# Patient Record
Sex: Male | Born: 1940 | Race: White | Hispanic: No | Marital: Married | Smoking: Former smoker
Health system: Southern US, Community
[De-identification: ages and names within clinical notes are randomized; demographics above are authoritative.]

## PROBLEM LIST (undated history)

## (undated) DIAGNOSIS — C8590 Non-Hodgkin lymphoma, unspecified, unspecified site: Secondary | ICD-10-CM

## (undated) DIAGNOSIS — F0390 Unspecified dementia without behavioral disturbance: Secondary | ICD-10-CM

## (undated) DIAGNOSIS — N400 Enlarged prostate without lower urinary tract symptoms: Secondary | ICD-10-CM

## (undated) DIAGNOSIS — I2510 Atherosclerotic heart disease of native coronary artery without angina pectoris: Secondary | ICD-10-CM

## (undated) DIAGNOSIS — I4891 Unspecified atrial fibrillation: Secondary | ICD-10-CM

## (undated) DIAGNOSIS — J449 Chronic obstructive pulmonary disease, unspecified: Secondary | ICD-10-CM

## (undated) DIAGNOSIS — I10 Essential (primary) hypertension: Secondary | ICD-10-CM

## (undated) HISTORY — PX: JOINT REPLACEMENT: SHX530

## (undated) HISTORY — PX: HERNIA REPAIR: SHX51

## (undated) HISTORY — PX: CORONARY ANGIOPLASTY WITH STENT PLACEMENT: SHX49

---

## 2004-08-16 ENCOUNTER — Ambulatory Visit: Payer: Self-pay | Admitting: Family Medicine

## 2005-08-30 ENCOUNTER — Ambulatory Visit: Payer: Self-pay | Admitting: Family Medicine

## 2005-09-24 ENCOUNTER — Other Ambulatory Visit: Payer: Self-pay

## 2005-09-30 ENCOUNTER — Inpatient Hospital Stay: Payer: Self-pay | Admitting: Specialist

## 2008-07-19 ENCOUNTER — Ambulatory Visit: Payer: Self-pay | Admitting: Gastroenterology

## 2009-12-25 ENCOUNTER — Ambulatory Visit: Payer: Self-pay | Admitting: Family Medicine

## 2012-08-03 DIAGNOSIS — N401 Enlarged prostate with lower urinary tract symptoms: Secondary | ICD-10-CM | POA: Insufficient documentation

## 2012-08-03 DIAGNOSIS — N529 Male erectile dysfunction, unspecified: Secondary | ICD-10-CM | POA: Insufficient documentation

## 2012-08-06 ENCOUNTER — Ambulatory Visit: Payer: Self-pay | Admitting: Internal Medicine

## 2012-08-26 ENCOUNTER — Ambulatory Visit: Payer: Self-pay | Admitting: Internal Medicine

## 2012-08-26 LAB — CK TOTAL AND CKMB (NOT AT ARMC)
CK, Total: 69 U/L (ref 35–232)
CK-MB: 2.4 ng/mL (ref 0.5–3.6)

## 2012-08-27 LAB — BASIC METABOLIC PANEL
BUN: 14 mg/dL (ref 7–18)
Calcium, Total: 8.6 mg/dL (ref 8.5–10.1)
Creatinine: 1.2 mg/dL (ref 0.60–1.30)
EGFR (African American): >60
EGFR (Non-African Amer.): >60
Glucose: 97 mg/dL (ref 65–99)
Osmolality: 291 (ref 275–301)
Sodium: 146 mmol/L — ABNORMAL HIGH (ref 136–145)

## 2013-03-16 ENCOUNTER — Ambulatory Visit: Payer: Self-pay | Admitting: Urology

## 2013-03-16 LAB — CBC WITH DIFFERENTIAL/PLATELET
HCT: 45.1 % (ref 40.0–52.0)
Lymphocyte #: 2.5 10*3/uL (ref 1.0–3.6)
Lymphocyte %: 27.3 %
MCH: 29.5 pg (ref 26.0–34.0)
MCHC: 33.9 g/dL (ref 32.0–36.0)
MCV: 87 fL (ref 80–100)
Monocyte %: 6.7 %
Neutrophil #: 5.8 10*3/uL (ref 1.4–6.5)
Neutrophil %: 63.1 %
RBC: 5.19 10*6/uL (ref 4.40–5.90)
WBC: 9.2 10*3/uL (ref 3.8–10.6)

## 2013-03-16 LAB — BASIC METABOLIC PANEL
BUN: 16 mg/dL (ref 7–18)
Calcium, Total: 9.1 mg/dL (ref 8.5–10.1)
Creatinine: 1.07 mg/dL (ref 0.60–1.30)
Osmolality: 284 (ref 275–301)

## 2013-03-23 ENCOUNTER — Ambulatory Visit: Payer: Self-pay | Admitting: Urology

## 2013-03-24 LAB — BASIC METABOLIC PANEL
Anion Gap: 5 — ABNORMAL LOW (ref 7–16)
BUN: 17 mg/dL (ref 7–18)
Co2: 29 mmol/L (ref 21–32)
Creatinine: 1.23 mg/dL (ref 0.60–1.30)
EGFR (African American): >60
EGFR (Non-African Amer.): 60 — ABNORMAL LOW
Osmolality: 285 (ref 275–301)
Potassium: 3.7 mmol/L (ref 3.5–5.1)
Sodium: 142 mmol/L (ref 136–145)

## 2013-03-24 LAB — CBC WITH DIFFERENTIAL/PLATELET
Basophil #: 0 10*3/uL (ref 0.0–0.1)
Eosinophil %: 1.7 %
HCT: 38.5 % — ABNORMAL LOW (ref 40.0–52.0)
HGB: 13.4 g/dL (ref 13.0–18.0)
Lymphocyte %: 25.5 %
MCHC: 34.8 g/dL (ref 32.0–36.0)
Neutrophil %: 64.5 %
RBC: 4.43 10*6/uL (ref 4.40–5.90)
RDW: 13.7 % (ref 11.5–14.5)

## 2013-04-14 DIAGNOSIS — R310 Gross hematuria: Secondary | ICD-10-CM | POA: Insufficient documentation

## 2014-05-11 DIAGNOSIS — M109 Gout, unspecified: Secondary | ICD-10-CM | POA: Insufficient documentation

## 2014-05-11 DIAGNOSIS — I10 Essential (primary) hypertension: Secondary | ICD-10-CM | POA: Insufficient documentation

## 2014-05-11 DIAGNOSIS — M129 Arthropathy, unspecified: Secondary | ICD-10-CM | POA: Insufficient documentation

## 2014-05-11 DIAGNOSIS — I499 Cardiac arrhythmia, unspecified: Secondary | ICD-10-CM | POA: Insufficient documentation

## 2015-02-14 NOTE — Discharge Summary (Signed)
PATIENT NAME:  Victor Ramos, Victor Ramos MR#:  [id number] DATE OF BIRTH:  Dec 21, 1940  DATE OF ADMISSION:  08/26/2012 DATE OF DISCHARGE:  08/27/2012  DISCHARGE DIAGNOSES:  1. Coronary artery disease.  2. Hypertension.  3. Hyperlipidemia.  4. Atrial fibrillation.   HISTORY: This is a 74 year old with chest discomfort and unstable angina with coronary artery disease. The patient underwent cardiac catheterization showing minimal cardiac atherosclerosis of the left coronary artery system with a significant stenosis of the right coronary artery. The patient underwent PCI and stent placement of a drug-eluting stent without complication. Since then he has done fairly well with no evidence of further significant symptoms on appropriate medications. The patient was discharged on appropriate medication management with followup in 3 to 5 days.   DISCHARGE MEDICATIONS: 1. Plavix 75 mg p.o. daily.  2. Aspirin 325 mg p.o. daily. 3. Quinapril 20 mg p.o. daily.  4. Amlodipine 5 mg p.o. daily.  5. Tamsulosin 0.4 mg p.o. at bedtime.  6. Allopurinol 100 mg p.o. daily.  7. Metoprolol 25 mg p.o. daily.    ____________________________ Corey Skains, MD bjk:bjt D: 08/27/2012 08:49:33 ET T: 08/27/2012 10:21:10 ET JOB#: [id number]  cc: Corey Skains, MD, <Dictator> Corey Skains MD ELECTRONICALLY SIGNED 09/03/2012 13:44

## 2015-02-17 NOTE — Op Note (Signed)
PATIENT NAME:  Victor, Ramos MR#:  [id number] DATE OF BIRTH:  Aug 12, 1941  DATE OF PROCEDURE:  03/23/2013  PREOPERATIVE DIAGNOSIS: Benign prostatic hypertrophy with bladder outlet obstruction.   POSTOPERATIVE DIAGNOSIS: Benign prostatic hypertrophy with bladder outlet obstruction.  PROCEDURE: Transurethral resection of the prostate.   SURGEON: Edrick Oh, MD   ANESTHESIA: Laryngeal mask airway anesthesia.   INDICATIONS: The patient is a 74 year old gentleman with significant long-standing prostatic hypertrophy. He has been managed on medical management for some time. He has had significant progressive worsening of his voiding symptoms. Previous cystoscopy has demonstrated extensive intravesical protrusion of the prostate with partial median lobe formation. He presents for transurethral resection of the prostate.   PROCEDURE: After informed consent was obtained, the patient was taken to the operating room and placed in the dorsal lithotomy position under laryngeal mask airway anesthesia. The patient was then prepped and draped in the usual standard fashion. The saline resectoscope sheath was placed. Dilation of the distal meatal urethra was necessary for placement. This was dilated to 30-French utilizing male sounds. The scope was then easily passed utilizing the visual obturator. Upon entering the prostatic fossa, prominent bilobar hypertrophy was visualized with complete visual obstruction. Prostatic concretions were noted around the verumontanum. Extensive intravesical protrusion of the prostate was noted at the level of the bladder neck. The ureteral orifices were not initially easily identified due to the extent of intravesical tissue. A moderate amount of anterior tissue was also present. The bladder demonstrated no gross mucosal lesions throughout. The resectoscope loop was then placed through the scope. Resection was begun at the level of the central bladder neck and taken back to the level  of the verumontanum. A large amount of intravesical tissue was taken down as the resection progressed. Intermittent examination subsequently demonstrated the ureteral orifices.  These were noted to be an adequate distance from the bladder neck proper. The resection was taken down to the bladder neck extending along the lateral aspects of the prostate. A moderate amount of anterior tissue was also noted. This was also taken down utilizing the loop. Resection was then undertaken on the lateral lobes to the level of the verumontanum. A slight amount of tissue was left remaining at the level of the verumontanum. A more than adequate opening, however, was present. Multiple nodular areas of hypertrophy were noted throughout the prostate. Additional resection was undertaken on these areas. A moderate amount of purulent cavitary areas were also noted within the prostate. Numerous concretions were noted along the level of the ejaculatory duct. Once the bulk of the resection was undertaken, the prostate chips were irrigated from the bladder. The button electrode was then utilized to resect additional prostate base and lateral tissue. The areas were then extensively cauterized utilizing the loop. Several additional prostate chips were present within the bladder. They were irrigated free. The bleeding was adequately controlled with overall approximately 100 mL of blood loss estimated upon completion of the resection. The resectoscope was removed. A 22 French 3-way Foley catheter was placed to gravity drainage and to continuous bladder irrigation. The patient was returned to the supine position and awakened from laryngeal mask airway anesthesia. He was taken to the recovery room in stable condition. There were no problems or complications. The patient tolerated the procedure well.  ____________________________ Denice Bors. Jacqlyn Larsen, MD bsc:cb D: 03/23/2013 16:08:12 ET T: 03/23/2013 16:57:26 ET JOB#: [id number]  cc: Denice Bors. Jacqlyn Larsen,  MD, <Dictator> Denice Bors  MD ELECTRONICALLY SIGNED 03/25/2013 11:31

## 2015-02-17 NOTE — Discharge Summary (Signed)
PATIENT NAME:  Victor Ramos, Victor Ramos MR#:  [id number] DATE OF BIRTH:  12/17/1940  DATE OF ADMISSION:  03/23/2013 DATE OF DISCHARGE:  03/24/2013  PRINCIPLE DIAGNOSIS:  Benign prostatic hypertrophy, bladder outlet obstruction.   POSTOPERATIVE DIAGNOSES:  Benign prostatic hypertrophy, bladder outlet obstruction.   PROCEDURE:  Transurethral resection of prostate.   INDICATIONS:  The patient is a 74 year old gentleman with a long history of prostatic hypertrophy who has been on medical management.  He has had progressive worsening of his voiding symptoms.  He has elected to proceed with transurethral resection of prostate as treatment for his obstructive symptoms.  He presents for this purpose.   HOSPITAL COURSE:  Victor Ramos was taken to the operating room on 03/23/2013 at which time he underwent a transurethral resection of the prostate utilizing the saline resectoscope.  A significant amount of intravesical tissue was present and resected.  No significant bleeding was encountered.  He had been maintained on aspirin due to cardiac issues.  This was not a significant issue during the surgery.  His postoperative course was without significant problems or complications.  He was maintained on continuous bladder irrigation until the morning of postoperative day 1.  His bladder irrigation was only minimally blood-tinged on the irrigation.  The Foley catheter and bladder irrigation was discontinued.  He was noted to void spontaneously with a minimum bladder residual.  He remained afebrile, vital signs stable throughout his hospitalization.  He was tolerating a general diet.  He was ambulating without difficulty.  He continued to do well and was subsequently discharged to home on 03/24/2013.  He was discharged on his usual admission medications in addition to his aspirin.  The only medication he is off of at present is Plavix.  He was discharged on Cipro 500 mg twice daily for a seven day course and Vicodin 1 to 2 every  4 to 6 hours as needed for pain.  He is to avoid any significant heavy lifting or straining for at least 2 to 3 weeks.  He is to follow up in 7 to 14 days for re-evaluation in the office.  He is to notify us if there are any further problems or questions in the interim.    ____________________________ Denice Bors Jacqlyn Larsen, MD bsc:ea D: 03/25/2013 22:54:28 ET T: 03/26/2013 06:44:47 ET JOB#: [id number]  cc: Denice Bors. Jacqlyn Larsen, MD, <Dictator> Denice Bors. Jacqlyn Larsen, MD Denice Bors  MD ELECTRONICALLY SIGNED 03/31/2013 13:33

## 2015-04-13 ENCOUNTER — Ambulatory Visit
Admission: EM | Admit: 2015-04-13 | Discharge: 2015-04-13 | Disposition: A | Discharge status: Home / Self Care | Payer: Medicare Other | Attending: Internal Medicine | Admitting: Internal Medicine

## 2015-04-13 DIAGNOSIS — N400 Enlarged prostate without lower urinary tract symptoms: Secondary | ICD-10-CM | POA: Insufficient documentation

## 2015-04-13 DIAGNOSIS — I482 Chronic atrial fibrillation, unspecified: Secondary | ICD-10-CM | POA: Insufficient documentation

## 2015-04-13 DIAGNOSIS — Z79899 Other long term (current) drug therapy: Secondary | ICD-10-CM | POA: Insufficient documentation

## 2015-04-13 DIAGNOSIS — I10 Essential (primary) hypertension: Secondary | ICD-10-CM | POA: Insufficient documentation

## 2015-04-13 DIAGNOSIS — Z87891 Personal history of nicotine dependence: Secondary | ICD-10-CM | POA: Diagnosis not present

## 2015-04-13 DIAGNOSIS — Z7982 Long term (current) use of aspirin: Secondary | ICD-10-CM | POA: Insufficient documentation

## 2015-04-13 DIAGNOSIS — Z8679 Personal history of other diseases of the circulatory system: Secondary | ICD-10-CM

## 2015-04-13 DIAGNOSIS — X58XXXA Exposure to other specified factors, initial encounter: Secondary | ICD-10-CM | POA: Diagnosis not present

## 2015-04-13 DIAGNOSIS — S30861A Insect bite (nonvenomous) of abdominal wall, initial encounter: Secondary | ICD-10-CM | POA: Diagnosis not present

## 2015-04-13 DIAGNOSIS — R0609 Other forms of dyspnea: Secondary | ICD-10-CM | POA: Diagnosis not present

## 2015-04-13 DIAGNOSIS — I2510 Atherosclerotic heart disease of native coronary artery without angina pectoris: Secondary | ICD-10-CM | POA: Insufficient documentation

## 2015-04-13 HISTORY — DX: Unspecified atrial fibrillation: I48.91

## 2015-04-13 HISTORY — DX: Essential (primary) hypertension: I10

## 2015-04-13 HISTORY — DX: Benign prostatic hyperplasia without lower urinary tract symptoms: N40.0

## 2015-04-13 NOTE — Discharge Instructions (Signed)
See Dr Serafina Royals, [facility], as above for worsening breathlessness, to discuss further evaluation.

## 2015-04-13 NOTE — ED Notes (Signed)
Found tick posterior right low back about a month ago. Wife removed tick and a welt was noted. Since removal patient has noticed increased weakness and states "I get winded more easily than normal". +joint pain. Denies fever and chills.

## 2015-04-13 NOTE — ED Provider Notes (Signed)
CSN: [id number]     Arrival date & time 04/13/15  0930 History   First MD Initiated Contact with Patient 04/13/15 1005     Chief Complaint  Patient presents with  . Insect Bite   HPI  Patient removed a non-engorged tick from his right flank about a month ago, and had an itchy red spot, 1.5 inches, that slowly resolved over a couple weeks. Also in the last month has had fatigue, and worsening exercise tolerance for usual activities. Having to stop and rest during mowing and weedeating, feeling particularly breathless in the last week. Couple nights ago, had marked breathlessness going up a flight of stairs. When he stops to rest, symptoms subside in about 15 minutes. No paroxysmal nocturnal dyspnea. No change in leg swelling. Chronic A. Fib, on Pradaxa, was asymptomatic when dx'ed with a fib. Also having some achiness.  Past Medical History  Diagnosis Date  . Hypertension   . Atrial fibrillation   . BPH (benign prostatic hyperplasia)    Past Surgical History  Procedure Laterality Date  . Coronary angioplasty with stent placement    . Joint replacement    . Hernia repair     Family History  Problem Relation Age of Onset  . Cancer Mother   . Heart attack Father   . Cancer Brother    History  Substance Use Topics  . Smoking status: Former Research scientist (life sciences)  . Smokeless tobacco: Not on file  . Alcohol Use: Yes     Comment: rarely    Review of Systems  All other systems reviewed and are negative.   Allergies  Review of patient's allergies indicates no known allergies.  Home Medications   Prior to Admission medications   Medication Sig Start Date End Date Taking? Authorizing Provider  amLODipine (NORVASC) 10 MG tablet Take 10 mg by mouth daily.   Yes Historical Provider, MD  aspirin 81 MG tablet Take 81 mg by mouth daily.   Yes Historical Provider, MD  co-enzyme Q-10 30 MG capsule Take 100 mg by mouth daily.   Yes Historical Provider, MD  dabigatran (PRADAXA) 150 MG CAPS capsule Take  150 mg by mouth 2 (two) times daily.   Yes Historical Provider, MD  diphenhydrAMINE (BENADRYL) 25 MG tablet Take 25 mg by mouth every 6 (six) hours as needed.   Yes Historical Provider, MD  finasteride (PROSCAR) 5 MG tablet Take 5 mg by mouth daily.   Yes Historical Provider, MD  lisinopril (PRINIVIL,ZESTRIL) 40 MG tablet Take 40 mg by mouth daily.   Yes Historical Provider, MD  magnesium 30 MG tablet Take 250 mg by mouth 2 (two) times daily.   Yes Historical Provider, MD  Melatonin 10 MG TABS Take by mouth.   Yes Historical Provider, MD  metoprolol succinate (TOPROL-XL) 25 MG 24 hr tablet Take 25 mg by mouth 2 (two) times daily.   Yes Historical Provider, MD  potassium chloride (K-DUR,KLOR-CON) 10 MEQ tablet Take 10 mEq by mouth once.   Yes Historical Provider, MD  tamsulosin (FLOMAX) 0.4 MG CAPS capsule Take 0.4 mg by mouth.   Yes Historical Provider, MD   BP 135/96 mmHg  Pulse 80  Temp(Src) 98.5 F (36.9 C) (Oral)  Ht 5\' 8"  (1.727 m)  Wt 210 lb (95.255 kg)  BMI 31.94 kg/m2  SpO2 100% Physical Exam  Constitutional: He is oriented to person, place, and time. No distress.  Alert, nicely groomed  HENT:  Head: Atraumatic.  Eyes:  Conjugate gaze, no eye redness/drainage  Neck: Neck supple.  Cardiovascular: Normal rate.   irreg irreg rhythm  Pulmonary/Chest: He has no wheezes. He has no rales.  Somewhat coarse breath sounds throughout, symmetric breath sounds; slightly increased resp effort, looks slightly breathless at rest  Abdominal: Soft. He exhibits no distension.  Musculoskeletal: Normal range of motion.  1+ B LE pitting edema  Neurological: He is alert and oriented to person, place, and time.  Skin: Skin is warm and dry.  No cyanosis; pink/tanned  Nursing note and vitals reviewed.   ED Course  Procedures (including critical care time) Labs Review Labs Reviewed - No data to display  Imaging Review No results found.  ECG in urgent care demonstrates A fib, rate of 64.   QRS axis 11/unremarkable.  No acute ST or T wave changes.  Low voltage across precordium/V1,V2,V3, seen on previous ECG of 08/26/2012 10:09. I do not appreciate significant change from the previous ECG.    MDM   1. DOE (dyspnea on exertion)   2. Personal history of coronary artery disease    Pt's pcp/Dr Niger Reid/[facility] not in office.  Spoke with Dr [facility], who arranged for pt to be seen in his office with Dr Nehemiah Massed at 1:30pm today for evaluation for accelerated anginal equivalent.  Sherlene Shams, MD 04/13/15 [phone]

## 2016-09-27 ENCOUNTER — Emergency Department
Admission: EM | Admit: 2016-09-27 | Discharge: 2016-09-27 | Disposition: A | Discharge status: Home / Self Care | Payer: Medicare Other | Attending: Emergency Medicine | Admitting: Emergency Medicine

## 2016-09-27 ENCOUNTER — Encounter: Payer: Self-pay | Admitting: Emergency Medicine

## 2016-09-27 ENCOUNTER — Emergency Department: Payer: Medicare Other

## 2016-09-27 DIAGNOSIS — Z7982 Long term (current) use of aspirin: Secondary | ICD-10-CM | POA: Diagnosis not present

## 2016-09-27 DIAGNOSIS — M5030 Other cervical disc degeneration, unspecified cervical region: Secondary | ICD-10-CM | POA: Insufficient documentation

## 2016-09-27 DIAGNOSIS — Z87891 Personal history of nicotine dependence: Secondary | ICD-10-CM | POA: Diagnosis not present

## 2016-09-27 DIAGNOSIS — Z955 Presence of coronary angioplasty implant and graft: Secondary | ICD-10-CM | POA: Diagnosis not present

## 2016-09-27 DIAGNOSIS — Z79899 Other long term (current) drug therapy: Secondary | ICD-10-CM | POA: Insufficient documentation

## 2016-09-27 DIAGNOSIS — I10 Essential (primary) hypertension: Secondary | ICD-10-CM | POA: Insufficient documentation

## 2016-09-27 DIAGNOSIS — M542 Cervicalgia: Secondary | ICD-10-CM | POA: Diagnosis present

## 2016-09-27 MED ORDER — CELECOXIB 200 MG PO CAPS: 200.0000 mg | ORAL_CAPSULE | Freq: Every day | ORAL | 1 refills | Status: AC

## 2016-09-27 MED ORDER — HYDROCODONE-ACETAMINOPHEN 5-325 MG PO TABS
1.0000 | ORAL_TABLET | Freq: Once | ORAL | Status: AC
Start: 2016-09-27 — End: 2016-09-27
  Administered 2016-09-27: 1 via ORAL
  Filled 2016-09-27: qty 1

## 2016-09-27 MED ORDER — HYDROCODONE-ACETAMINOPHEN 5-325 MG PO TABS: 1.0000 | ORAL_TABLET | ORAL | 0 refills | Status: DC | PRN

## 2016-09-27 NOTE — ED Triage Notes (Signed)
Pt reports has knot on the back of his neck for 5-10 years. Pt states his neck began to hurt 3-4 days ago. Small lump palpable to back of neck. Pt in no apparent distress in triage.

## 2016-09-27 NOTE — ED Notes (Signed)
Patient transported to CT scan at this time.

## 2016-09-27 NOTE — Discharge Instructions (Signed)
Begin taking pain medication as directed. be aware that this medicine can cause drowsiness increase her risk for falling. Begin taking Celebrex with food as directed. Contact your regular medical doctor if any continued problems.

## 2016-09-27 NOTE — ED Provider Notes (Signed)
[facility] Emergency Department Provider Note  ____________________________________________   First MD Initiated Contact with Patient 09/27/16 [phone]     (approximate)  I have reviewed the triage vital signs and the nursing notes.   HISTORY  Chief Complaint Neck Pain and Cyst   HPI Victor Ramos is a 75 y.o. male for complaint of neck pain.He states he has a knot on his neck for the last 5-10 years. There is been no changes in the area or size of this "knot". Patient states that his neck began hurting 3-4 days ago. He states that a year ago he was seen by ENT for ear pain and was told that most likely it was arthritis. He has been taking Celebrex for the last year and ran out of it a week or so ago. Until then he had decreased neck pain. Neck pain is worse with range of motion. He denies any paresthesias in his upper or lower extremities. He denies any fever and wife denies seeing his "knot" looking red. Currently he rates his pain as a 5/10.   Past Medical History:  Diagnosis Date  . Atrial fibrillation ([place])   . BPH (benign prostatic hyperplasia)   . Hypertension     There are no active problems to display for this patient.   Past Surgical History:  Procedure Laterality Date  . CORONARY ANGIOPLASTY WITH STENT PLACEMENT    . HERNIA REPAIR    . JOINT REPLACEMENT      Prior to Admission medications   Medication Sig Start Date End Date Taking? Authorizing Provider  amLODipine (NORVASC) 10 MG tablet Take 10 mg by mouth daily.    Historical Provider, MD  aspirin 81 MG tablet Take 81 mg by mouth daily.    Historical Provider, MD  celecoxib (CELEBREX) 200 MG capsule Take 1 capsule (200 mg total) by mouth daily. 09/27/16 09/27/17  Johnn Hai, PA-C  co-enzyme Q-10 30 MG capsule Take 100 mg by mouth daily.    Historical Provider, MD  dabigatran (PRADAXA) 150 MG CAPS capsule Take 150 mg by mouth 2 (two) times daily.    Historical Provider, MD    diphenhydrAMINE (BENADRYL) 25 MG tablet Take 25 mg by mouth every 6 (six) hours as needed.    Historical Provider, MD  finasteride (PROSCAR) 5 MG tablet Take 5 mg by mouth daily.    Historical Provider, MD  HYDROcodone-acetaminophen (NORCO/VICODIN) 5-325 MG tablet Take 1 tablet by mouth every 4 (four) hours as needed for moderate pain. 09/27/16   Johnn Hai, PA-C  lisinopril (PRINIVIL,ZESTRIL) 40 MG tablet Take 40 mg by mouth daily.    Historical Provider, MD  magnesium 30 MG tablet Take 250 mg by mouth 2 (two) times daily.    Historical Provider, MD  Melatonin 10 MG TABS Take by mouth.    Historical Provider, MD  metoprolol succinate (TOPROL-XL) 25 MG 24 hr tablet Take 25 mg by mouth 2 (two) times daily.    Historical Provider, MD  potassium chloride (K-DUR,KLOR-CON) 10 MEQ tablet Take 10 mEq by mouth once.    Historical Provider, MD  tamsulosin (FLOMAX) 0.4 MG CAPS capsule Take 0.4 mg by mouth.    Historical Provider, MD    Allergies Patient has no known allergies.  Family History  Problem Relation Age of Onset  . Cancer Mother   . Heart attack Father   . Cancer Brother     Social History Social History  Substance Use Topics  .  Smoking status: Former Research scientist (life sciences)  . Smokeless tobacco: Not on file  . Alcohol use Yes     Comment: rarely    Review of Systems Constitutional: No fever/chills ENT: No sore throat. Cardiovascular: Denies chest pain. Respiratory: Denies shortness of breath. Gastrointestinal: No abdominal pain.  No nausea, no vomiting.   Musculoskeletal: Positive for neck pain. Skin: Negative for rash. Neurological: Negative for headaches, focal weakness or numbness.  10-point ROS otherwise negative.  ____________________________________________   PHYSICAL EXAM:  VITAL SIGNS: ED Triage Vitals [09/27/16 0714]  Enc Vitals Group     BP 131/69     Pulse Rate 90     Resp 18     Temp 98.1 F (36.7 C)     Temp Source Oral     SpO2 92 %     Weight 208 lb  (94.3 kg)     Height 5\' 8"  (1.727 m)     Head Circumference      Peak Flow      Pain Score 5     Pain Loc      Pain Edu?      Excl. in [place]?     Constitutional: Alert and oriented. Well appearing and in no acute distress. Eyes: Conjunctivae are normal. PERRL. EOMI. Head: Atraumatic. Nose: No congestion/rhinnorhea. Neck: No stridor.  Tenderness on palpation of cervical spine posteriorly. Range of motion increases pain. Range of motion is restricted secondary to pain. No gross deformity is noted. No active muscle spasm seen. Hematological/Lymphatic/Immunilogical: No cervical lymphadenopathy. Cardiovascular: Normal rate, regular rhythm. Grossly normal heart sounds.  Good peripheral circulation. Respiratory: Normal respiratory effort.  No retractions. Lungs CTAB. Gastrointestinal: Soft and nontender. No distention.  Musculoskeletal: Moves upper and lower extremities without any difficulty. Good muscle strength bilaterally upper and lower extremities. Neurologic:  Normal speech and language. No gross focal neurologic deficits are appreciated. No gait instability. Skin:  Skin is warm, dry and intact. On examination of the upper back there is a non-erythematous soft and nontender lesion most likely a sebaceous cyst approximately 2 cm in diameter. Area is mobile, nontender, no drainage. This area is located to the right upper thorax. Psychiatric: Mood and affect are normal. Speech and behavior are normal.  ____________________________________________   LABS (all labs ordered are listed, but only abnormal results are displayed)  Labs Reviewed - No data to display  RADIOLOGY Cervical spine x-ray per radiologist shows multilevel disc space narrowing more pronounced at C5-C6. I, Johnn Hai, personally viewed and evaluated these images (plain radiographs) as part of my medical decision making, as well as reviewing the written report by the  radiologist.  ____________________________________________   PROCEDURES  Procedure(s) performed: None  Procedures  Critical Care performed: No  ____________________________________________   INITIAL IMPRESSION / ASSESSMENT AND PLAN / ED COURSE  Pertinent labs & imaging results that were available during my care of the patient were reviewed by me and considered in my medical decision making (see chart for details).    Clinical Course    Patient was given Norco while in the emergency room since he was not the driver. We discussed restarting his Celebrex 200 mg as he's been taking his done well over the last year for his arthritis. Patient states he has not told his medical doctor about Celebrex but was given this prescription by Dr. Tami Ribas when he was seen for an ear problem. Patient has continued to take the prescription for the past year without any difficulties. We discussed restarting the  Celebrex and he is to take Norco as needed for severe pain. Patient states that he will follow-up with his primary care doctor at the [facility] for any continued problems. Patient was somewhat disgruntled over not seeing an M.D. however when this was offered patient states that he will not sit around any longer and wait on anyone.  ____________________________________________   FINAL CLINICAL IMPRESSION(S) / ED DIAGNOSES  Final diagnoses:  Cervical spine pain  Degenerative disc disease, cervical      NEW MEDICATIONS STARTED DURING THIS VISIT:  Discharge Medication List as of 09/27/2016  9:10 AM    START taking these medications   Details  celecoxib (CELEBREX) 200 MG capsule Take 1 capsule (200 mg total) by mouth daily., Starting Fri 09/27/2016, Until Sat 09/27/2017, Print    HYDROcodone-acetaminophen (NORCO/VICODIN) 5-325 MG tablet Take 1 tablet by mouth every 4 (four) hours as needed for moderate pain., Starting Fri 09/27/2016, Print         Note:  This document was prepared  using Dragon voice recognition software and may include unintentional dictation errors.    Johnn Hai, PA-C 09/27/16 [place], MD 09/29/16 [phone]

## 2018-07-11 ENCOUNTER — Emergency Department
Admission: EM | Admit: 2018-07-11 | Discharge: 2018-07-11 | Disposition: A | Discharge status: Home / Self Care | Payer: Medicare Other | Source: Home / Self Care

## 2018-07-11 ENCOUNTER — Other Ambulatory Visit: Payer: Self-pay

## 2018-07-11 DIAGNOSIS — K460 Unspecified abdominal hernia with obstruction, without gangrene: Secondary | ICD-10-CM | POA: Diagnosis not present

## 2018-07-11 DIAGNOSIS — K4031 Unilateral inguinal hernia, with obstruction, without gangrene, recurrent: Secondary | ICD-10-CM | POA: Diagnosis not present

## 2018-07-11 DIAGNOSIS — Z5321 Procedure and treatment not carried out due to patient leaving prior to being seen by health care provider: Secondary | ICD-10-CM | POA: Insufficient documentation

## 2018-07-11 DIAGNOSIS — R1031 Right lower quadrant pain: Secondary | ICD-10-CM | POA: Insufficient documentation

## 2018-07-11 NOTE — ED Triage Notes (Signed)
First Nurse Note:  C/O RLQ and groin pain.  Patient states he thinks he has an inguinal hernia.  Has had several in the past.

## 2018-07-11 NOTE — ED Triage Notes (Signed)
Right sided hernia appearing like area that has been present for a few days, pain that started this AM. Hx of same, has had surgery to same area.

## 2018-07-13 ENCOUNTER — Encounter
Admission: EM | Disposition: A | Discharge status: Home / Self Care | Payer: Self-pay | Source: Home / Self Care | Attending: Surgery

## 2018-07-13 ENCOUNTER — Inpatient Hospital Stay: Payer: Medicare Other | Admitting: Certified Registered Nurse Anesthetist

## 2018-07-13 ENCOUNTER — Emergency Department: Payer: Medicare Other

## 2018-07-13 ENCOUNTER — Inpatient Hospital Stay
Admission: EM | Admit: 2018-07-13 | Discharge: 2018-07-17 | DRG: 329 | Disposition: A | Discharge status: Home / Self Care | Payer: Medicare Other | Attending: Surgery | Admitting: Surgery

## 2018-07-13 ENCOUNTER — Encounter: Payer: Self-pay | Admitting: Emergency Medicine

## 2018-07-13 ENCOUNTER — Other Ambulatory Visit: Payer: Self-pay

## 2018-07-13 DIAGNOSIS — Z79899 Other long term (current) drug therapy: Secondary | ICD-10-CM | POA: Diagnosis not present

## 2018-07-13 DIAGNOSIS — E876 Hypokalemia: Secondary | ICD-10-CM | POA: Diagnosis not present

## 2018-07-13 DIAGNOSIS — K4031 Unilateral inguinal hernia, with obstruction, without gangrene, recurrent: Principal | ICD-10-CM | POA: Diagnosis present

## 2018-07-13 DIAGNOSIS — N400 Enlarged prostate without lower urinary tract symptoms: Secondary | ICD-10-CM | POA: Diagnosis present

## 2018-07-13 DIAGNOSIS — K55029 Acute infarction of small intestine, extent unspecified: Secondary | ICD-10-CM | POA: Diagnosis present

## 2018-07-13 DIAGNOSIS — D72829 Elevated white blood cell count, unspecified: Secondary | ICD-10-CM | POA: Diagnosis present

## 2018-07-13 DIAGNOSIS — Z955 Presence of coronary angioplasty implant and graft: Secondary | ICD-10-CM

## 2018-07-13 DIAGNOSIS — Z7951 Long term (current) use of inhaled steroids: Secondary | ICD-10-CM | POA: Diagnosis not present

## 2018-07-13 DIAGNOSIS — I10 Essential (primary) hypertension: Secondary | ICD-10-CM | POA: Diagnosis present

## 2018-07-13 DIAGNOSIS — Z7982 Long term (current) use of aspirin: Secondary | ICD-10-CM | POA: Diagnosis not present

## 2018-07-13 DIAGNOSIS — R451 Restlessness and agitation: Secondary | ICD-10-CM | POA: Diagnosis not present

## 2018-07-13 DIAGNOSIS — Z87891 Personal history of nicotine dependence: Secondary | ICD-10-CM

## 2018-07-13 DIAGNOSIS — K460 Unspecified abdominal hernia with obstruction, without gangrene: Secondary | ICD-10-CM | POA: Diagnosis present

## 2018-07-13 DIAGNOSIS — F10231 Alcohol dependence with withdrawal delirium: Secondary | ICD-10-CM | POA: Diagnosis not present

## 2018-07-13 DIAGNOSIS — Z9660 Presence of unspecified orthopedic joint implant: Secondary | ICD-10-CM | POA: Diagnosis present

## 2018-07-13 DIAGNOSIS — I4891 Unspecified atrial fibrillation: Secondary | ICD-10-CM | POA: Diagnosis present

## 2018-07-13 DIAGNOSIS — Z7901 Long term (current) use of anticoagulants: Secondary | ICD-10-CM | POA: Diagnosis not present

## 2018-07-13 DIAGNOSIS — K5900 Constipation, unspecified: Secondary | ICD-10-CM | POA: Diagnosis present

## 2018-07-13 HISTORY — PX: INGUINAL HERNIA REPAIR: SHX194

## 2018-07-13 HISTORY — PX: BOWEL RESECTION: SHX1257

## 2018-07-13 LAB — URINALYSIS, COMPLETE (UACMP) WITH MICROSCOPIC
BILIRUBIN URINE: NEGATIVE
Glucose, UA: NEGATIVE mg/dL
KETONES UR: NEGATIVE mg/dL
Nitrite: NEGATIVE
PROTEIN: 30 mg/dL — AB
Specific Gravity, Urine: 1.011 (ref 1.005–1.030)
pH: 5 (ref 5.0–8.0)

## 2018-07-13 LAB — BASIC METABOLIC PANEL
Anion gap: 13 (ref 5–15)
BUN: 37 mg/dL — AB (ref 8–23)
CHLORIDE: 93 mmol/L — AB (ref 98–111)
CO2: 30 mmol/L (ref 22–32)
Calcium: 10.7 mg/dL — ABNORMAL HIGH (ref 8.9–10.3)
Creatinine, Ser: 2.09 mg/dL — ABNORMAL HIGH (ref 0.61–1.24)
GFR calc Af Amer: 34 mL/min — ABNORMAL LOW (ref >60)
GFR calc non Af Amer: 30 mL/min — ABNORMAL LOW (ref >60)
Glucose, Bld: 144 mg/dL — ABNORMAL HIGH (ref 70–99)
POTASSIUM: 4 mmol/L (ref 3.5–5.1)
SODIUM: 136 mmol/L (ref 135–145)

## 2018-07-13 LAB — CBC
HEMATOCRIT: 50.2 % (ref 40.0–52.0)
Hemoglobin: 17.7 g/dL (ref 13.0–18.0)
MCH: 32 pg (ref 26.0–34.0)
MCHC: 35.2 g/dL (ref 32.0–36.0)
MCV: 90.7 fL (ref 80.0–100.0)
Platelets: 200 10*3/uL (ref 150–440)
RBC: 5.54 MIL/uL (ref 4.40–5.90)
RDW: 13.6 % (ref 11.5–14.5)
WBC: 24.8 10*3/uL — AB (ref 3.8–10.6)

## 2018-07-13 LAB — GLUCOSE, CAPILLARY: GLUCOSE-CAPILLARY: 140 mg/dL — AB (ref 70–99)

## 2018-07-13 SURGERY — REPAIR, HERNIA, INGUINAL, ADULT
Anesthesia: General | Laterality: Right

## 2018-07-13 MED ORDER — IDARUCIZUMAB 2.5 GM/50ML IV SOLN
5.0000 g | INTRAVENOUS | Status: AC
  Administered 2018-07-13: 5 g via INTRAVENOUS
  Filled 2018-07-13 (×3): qty 100

## 2018-07-13 MED ORDER — SUGAMMADEX SODIUM 200 MG/2ML IV SOLN
INTRAVENOUS | Status: DC | PRN
  Administered 2018-07-13: 200 mg via INTRAVENOUS

## 2018-07-13 MED ORDER — FENTANYL CITRATE (PF) 100 MCG/2ML IJ SOLN
25.0000 ug | INTRAMUSCULAR | Status: DC | PRN
  Administered 2018-07-13: 50 ug via INTRAVENOUS
  Administered 2018-07-13: 25 ug via INTRAVENOUS

## 2018-07-13 MED ORDER — BUPIVACAINE LIPOSOME 1.3 % IJ SUSP
INTRAMUSCULAR | Status: AC
  Filled 2018-07-13: qty 20

## 2018-07-13 MED ORDER — TIOTROPIUM BROMIDE MONOHYDRATE 18 MCG IN CAPS
18.0000 ug | ORAL_CAPSULE | Freq: Every day | RESPIRATORY_TRACT | Status: DC
  Administered 2018-07-14 – 2018-07-17 (×4): 18 ug via RESPIRATORY_TRACT
  Filled 2018-07-13: qty 5

## 2018-07-13 MED ORDER — HEPARIN SODIUM (PORCINE) 5000 UNIT/ML IJ SOLN
5000.0000 [IU] | Freq: Three times a day (TID) | INTRAMUSCULAR | Status: DC
  Administered 2018-07-13 – 2018-07-16 (×7): 5000 [IU] via SUBCUTANEOUS
  Filled 2018-07-13 (×7): qty 1

## 2018-07-13 MED ORDER — OXYCODONE HCL 5 MG PO TABS: 5.0000 mg | ORAL_TABLET | Freq: Once | ORAL | Status: DC | PRN

## 2018-07-13 MED ORDER — IOPAMIDOL (ISOVUE-300) INJECTION 61%
30.0000 mL | Freq: Once | INTRAVENOUS | Status: AC | PRN
  Administered 2018-07-13: 30 mL via ORAL

## 2018-07-13 MED ORDER — DEXAMETHASONE SODIUM PHOSPHATE 10 MG/ML IJ SOLN
INTRAMUSCULAR | Status: DC | PRN
  Administered 2018-07-13: 10 mg via INTRAVENOUS

## 2018-07-13 MED ORDER — SODIUM CHLORIDE 0.9 % IV SOLN
1000.0000 mL | Freq: Once | INTRAVENOUS | Status: AC
  Administered 2018-07-13: 1000 mL via INTRAVENOUS

## 2018-07-13 MED ORDER — KETOROLAC TROMETHAMINE 30 MG/ML IJ SOLN
15.0000 mg | Freq: Four times a day (QID) | INTRAMUSCULAR | Status: DC | PRN
  Administered 2018-07-14: 15 mg via INTRAVENOUS
  Filled 2018-07-13: qty 1

## 2018-07-13 MED ORDER — FENTANYL CITRATE (PF) 100 MCG/2ML IJ SOLN
INTRAMUSCULAR | Status: AC
  Administered 2018-07-13: 50 ug via INTRAVENOUS
  Filled 2018-07-13: qty 2

## 2018-07-13 MED ORDER — MIDAZOLAM HCL 2 MG/2ML IJ SOLN
INTRAMUSCULAR | Status: DC | PRN
  Administered 2018-07-13: 2 mg via INTRAVENOUS

## 2018-07-13 MED ORDER — PHENOL 1.4 % MT LIQD
1.0000 | OROMUCOSAL | Status: DC | PRN
  Administered 2018-07-13: 1 via OROMUCOSAL
  Filled 2018-07-13: qty 177

## 2018-07-13 MED ORDER — BUPIVACAINE-EPINEPHRINE 0.5% -1:200000 IJ SOLN
INTRAMUSCULAR | Status: DC | PRN
  Administered 2018-07-13: 30 mL

## 2018-07-13 MED ORDER — OXYCODONE HCL 5 MG/5ML PO SOLN: 5.0000 mg | Freq: Once | ORAL | Status: DC | PRN

## 2018-07-13 MED ORDER — METOPROLOL TARTRATE 5 MG/5ML IV SOLN
5.0000 mg | Freq: Four times a day (QID) | INTRAVENOUS | Status: AC
  Administered 2018-07-13 – 2018-07-14 (×3): 5 mg via INTRAVENOUS
  Filled 2018-07-13 (×3): qty 5

## 2018-07-13 MED ORDER — ONDANSETRON HCL 4 MG/2ML IJ SOLN
INTRAMUSCULAR | Status: DC | PRN
  Administered 2018-07-13: 4 mg via INTRAVENOUS

## 2018-07-13 MED ORDER — LIDOCAINE HCL URETHRAL/MUCOSAL 2 % EX GEL
CUTANEOUS | Status: AC
  Filled 2018-07-13: qty 5

## 2018-07-13 MED ORDER — HYDRALAZINE HCL 20 MG/ML IJ SOLN: 10.0000 mg | Freq: Four times a day (QID) | INTRAMUSCULAR | Status: DC | PRN

## 2018-07-13 MED ORDER — HYDROMORPHONE HCL 1 MG/ML IJ SOLN
0.5000 mg | INTRAMUSCULAR | Status: DC | PRN
  Filled 2018-07-13: qty 0.5

## 2018-07-13 MED ORDER — SODIUM CHLORIDE 0.9 % IV SOLN
INTRAVENOUS | Status: DC
  Administered 2018-07-13 – 2018-07-16 (×7): via INTRAVENOUS

## 2018-07-13 MED ORDER — ONDANSETRON HCL 4 MG/2ML IJ SOLN
4.0000 mg | Freq: Four times a day (QID) | INTRAMUSCULAR | Status: DC | PRN
  Administered 2018-07-14 – 2018-07-17 (×2): 4 mg via INTRAVENOUS
  Filled 2018-07-13 (×2): qty 2

## 2018-07-13 MED ORDER — BUPIVACAINE-EPINEPHRINE (PF) 0.5% -1:200000 IJ SOLN
INTRAMUSCULAR | Status: AC
  Filled 2018-07-13: qty 30

## 2018-07-13 MED ORDER — ROCURONIUM BROMIDE 100 MG/10ML IV SOLN
INTRAVENOUS | Status: DC | PRN
  Administered 2018-07-13: 50 mg via INTRAVENOUS
  Administered 2018-07-13: 10 mg via INTRAVENOUS

## 2018-07-13 MED ORDER — LIDOCAINE HCL (CARDIAC) PF 100 MG/5ML IV SOSY
PREFILLED_SYRINGE | INTRAVENOUS | Status: DC | PRN
  Administered 2018-07-13: 40 mg via INTRAVENOUS

## 2018-07-13 MED ORDER — FENTANYL CITRATE (PF) 100 MCG/2ML IJ SOLN
INTRAMUSCULAR | Status: DC | PRN
  Administered 2018-07-13 (×2): 50 ug via INTRAVENOUS
  Administered 2018-07-13: 25 ug via INTRAVENOUS

## 2018-07-13 MED ORDER — PROPOFOL 10 MG/ML IV BOLUS
INTRAVENOUS | Status: DC | PRN
  Administered 2018-07-13: 150 mg via INTRAVENOUS

## 2018-07-13 MED ORDER — BUPIVACAINE LIPOSOME 1.3 % IJ SUSP
INTRAMUSCULAR | Status: DC | PRN
  Administered 2018-07-13: 20 mL

## 2018-07-13 MED ORDER — LACTATED RINGERS IV SOLN
INTRAVENOUS | Status: DC | PRN
  Administered 2018-07-13: 13:00:00 via INTRAVENOUS

## 2018-07-13 MED ORDER — MOMETASONE FURO-FORMOTEROL FUM 200-5 MCG/ACT IN AERO
2.0000 | INHALATION_SPRAY | Freq: Two times a day (BID) | RESPIRATORY_TRACT | Status: DC
  Administered 2018-07-13 – 2018-07-17 (×7): 2 via RESPIRATORY_TRACT
  Filled 2018-07-13: qty 8.8

## 2018-07-13 MED ORDER — SODIUM CHLORIDE FLUSH 0.9 % IV SOLN
INTRAVENOUS | Status: AC
  Filled 2018-07-13: qty 50

## 2018-07-13 MED ORDER — PHENYLEPHRINE HCL 10 MG/ML IJ SOLN
INTRAMUSCULAR | Status: DC | PRN
  Administered 2018-07-13 (×6): 100 ug via INTRAVENOUS

## 2018-07-13 MED ORDER — ROCURONIUM BROMIDE 50 MG/5ML IV SOLN
INTRAVENOUS | Status: AC
  Filled 2018-07-13: qty 1

## 2018-07-13 MED ORDER — PIPERACILLIN-TAZOBACTAM 3.375 G IVPB
3.3750 g | Freq: Three times a day (TID) | INTRAVENOUS | Status: DC
  Administered 2018-07-13 – 2018-07-17 (×12): 3.375 g via INTRAVENOUS
  Filled 2018-07-13 (×13): qty 50

## 2018-07-13 MED ORDER — ONDANSETRON 4 MG PO TBDP: 4.0000 mg | ORAL_TABLET | Freq: Four times a day (QID) | ORAL | Status: DC | PRN

## 2018-07-13 MED ORDER — PIPERACILLIN-TAZOBACTAM 3.375 G IVPB 30 MIN
3.3750 g | Freq: Once | INTRAVENOUS | Status: AC
  Administered 2018-07-13: 3.375 g via INTRAVENOUS
  Filled 2018-07-13: qty 50

## 2018-07-13 MED ORDER — FENTANYL CITRATE (PF) 100 MCG/2ML IJ SOLN
INTRAMUSCULAR | Status: AC
  Filled 2018-07-13: qty 2

## 2018-07-13 MED ORDER — FAMOTIDINE IN NACL 20-0.9 MG/50ML-% IV SOLN
20.0000 mg | Freq: Two times a day (BID) | INTRAVENOUS | Status: DC
  Administered 2018-07-14 – 2018-07-17 (×7): 20 mg via INTRAVENOUS
  Filled 2018-07-13 (×8): qty 50

## 2018-07-13 MED ORDER — MIDAZOLAM HCL 2 MG/2ML IJ SOLN
INTRAMUSCULAR | Status: AC
  Filled 2018-07-13: qty 2

## 2018-07-13 SURGICAL SUPPLY — 58 items
BLADE SURG 15 STRL LF DISP TIS (BLADE) ×3 IMPLANT
BLADE SURG 15 STRL SS (BLADE) ×2
BULB RESERV EVAC DRAIN JP 100C (MISCELLANEOUS) ×5 IMPLANT
CANISTER SUCT 1200ML W/VALVE (MISCELLANEOUS) ×5 IMPLANT
CHLORAPREP W/TINT 10.5 ML (MISCELLANEOUS) ×5 IMPLANT
CHLORAPREP W/TINT 26ML (MISCELLANEOUS) ×5 IMPLANT
DERMABOND ADVANCED (GAUZE/BANDAGES/DRESSINGS) ×2
DERMABOND ADVANCED .7 DNX12 (GAUZE/BANDAGES/DRESSINGS) ×3 IMPLANT
DRAIN CHANNEL JP 15F RND 16 (MISCELLANEOUS) ×5 IMPLANT
DRAIN PENROSE 1/4X12 LTX (DRAIN) ×5 IMPLANT
DRAPE LAPAROTOMY 100X77 ABD (DRAPES) ×5 IMPLANT
DRSG OPSITE POSTOP 4X12 (GAUZE/BANDAGES/DRESSINGS) IMPLANT
DRSG OPSITE POSTOP 4X8 (GAUZE/BANDAGES/DRESSINGS) ×5 IMPLANT
DRSG TEGADERM 4X10 (GAUZE/BANDAGES/DRESSINGS) ×5 IMPLANT
DRSG TEGADERM 4X4.75 (GAUZE/BANDAGES/DRESSINGS) ×5 IMPLANT
ELECT CAUTERY BLADE 6.4 (BLADE) IMPLANT
ELECT REM PT RETURN 9FT ADLT (ELECTROSURGICAL) ×5
ELECTRODE REM PT RTRN 9FT ADLT (ELECTROSURGICAL) ×3 IMPLANT
GAUZE SPONGE 4X4 12PLY STRL (GAUZE/BANDAGES/DRESSINGS) ×5 IMPLANT
GLOVE SURG SYN 7.0 (GLOVE) ×20 IMPLANT
GLOVE SURG SYN 7.5  E (GLOVE) ×8
GLOVE SURG SYN 7.5 E (GLOVE) ×12 IMPLANT
GOWN STRL REUS W/ TWL LRG LVL3 (GOWN DISPOSABLE) ×12 IMPLANT
GOWN STRL REUS W/TWL LRG LVL3 (GOWN DISPOSABLE) ×8
LABEL OR SOLS (LABEL) ×5 IMPLANT
LIGASURE IMPACT 36 18CM CVD LR (INSTRUMENTS) ×5 IMPLANT
NEEDLE HYPO 22GX1.5 SAFETY (NEEDLE) ×10 IMPLANT
NS IRRIG 1000ML POUR BTL (IV SOLUTION) ×5 IMPLANT
NS IRRIG 500ML POUR BTL (IV SOLUTION) ×5 IMPLANT
PACK BASIN MAJOR ARMC (MISCELLANEOUS) ×5 IMPLANT
PACK BASIN MINOR ARMC (MISCELLANEOUS) ×5 IMPLANT
PACK COLON CLEAN CLOSURE (MISCELLANEOUS) IMPLANT
RELOAD PROXIMATE 75MM BLUE (ENDOMECHANICALS) ×15 IMPLANT
SEPRAFILM MEMBRANE 5X6 (MISCELLANEOUS) IMPLANT
SPONGE LAP 18X18 RF (DISPOSABLE) ×5 IMPLANT
STAPLER PROXIMATE 75MM BLUE (STAPLE) ×5 IMPLANT
STAPLER SKIN PROX 35W (STAPLE) IMPLANT
SUT ETHIBOND 0 MO6 C/R (SUTURE) ×10 IMPLANT
SUT ETHILON 3-0 FS-10 30 BLK (SUTURE) ×5
SUT MNCRL 4-0 (SUTURE)
SUT MNCRL 4-0 27XMFL (SUTURE)
SUT PDS AB 1 TP1 96 (SUTURE) IMPLANT
SUT PROLENE 0 CT 1 30 (SUTURE) IMPLANT
SUT PROLENE 2 0 SH DA (SUTURE) IMPLANT
SUT SILK 2 0 (SUTURE)
SUT SILK 2-0 18XBRD TIE 12 (SUTURE) IMPLANT
SUT SILK 3-0 (SUTURE) ×15 IMPLANT
SUT VIC AB 0 CT1 36 (SUTURE) ×20 IMPLANT
SUT VIC AB 2-0 CT1 (SUTURE) IMPLANT
SUT VIC AB 3-0 SH 27 (SUTURE)
SUT VIC AB 3-0 SH 27X BRD (SUTURE) IMPLANT
SUTURE EHLN 3-0 FS-10 30 BLK (SUTURE) ×3 IMPLANT
SUTURE MNCRL 4-0 27XMF (SUTURE) IMPLANT
SYR 10ML LL (SYRINGE) ×5 IMPLANT
SYR 20CC LL (SYRINGE) ×10 IMPLANT
SYR 30ML LL (SYRINGE) ×5 IMPLANT
SYR BULB IRRIG 60ML STRL (SYRINGE) ×5 IMPLANT
TRAY FOLEY MTR SLVR 16FR STAT (SET/KITS/TRAYS/PACK) ×5 IMPLANT

## 2018-07-13 NOTE — Transfer of Care (Signed)
Immediate Anesthesia Transfer of Care Note  Patient: Victor Ramos  Procedure(s) Performed: HERNIA REPAIR INGUINAL ADULT (Right ) SMALL BOWEL RESECTION  Patient Location: PACU  Anesthesia Type:General  Level of Consciousness: awake  Airway & Oxygen Therapy: Patient Spontanous Breathing and Patient connected to face mask oxygen  Post-op Assessment: Report given to RN and Post -op Vital signs reviewed and stable  Post vital signs: Reviewed  Last Vitals:  Vitals Value Taken Time  BP 144/90 07/13/2018  3:28 PM  Temp    Pulse 34 07/13/2018  3:28 PM  Resp 17 07/13/2018  3:28 PM  SpO2 100 % 07/13/2018  3:28 PM  Vitals shown include unvalidated device data.  Last Pain:  Vitals:   07/13/18 1305  TempSrc: Tympanic  PainSc: 0-No pain      Patients Stated Pain Goal: 0 (49/70/26 3785)  Complications: No apparent anesthesia complications

## 2018-07-13 NOTE — Anesthesia Post-op Follow-up Note (Signed)
Anesthesia QCDR form completed.        

## 2018-07-13 NOTE — Anesthesia Procedure Notes (Signed)
Procedure Name: Intubation Date/Time: 07/13/2018 1:21 PM Performed by: Allean Found, CRNA Pre-anesthesia Checklist: Patient identified, Patient being monitored, Timeout performed, Emergency Drugs available and Suction available Patient Re-evaluated:Patient Re-evaluated prior to induction Oxygen Delivery Method: Circle system utilized Preoxygenation: Pre-oxygenation with 100% oxygen Induction Type: IV induction Ventilation: Mask ventilation without difficulty Laryngoscope Size: Mac and McGraph Grade View: Grade I Tube type: Oral Tube size: 7.0 mm Number of attempts: 1 Airway Equipment and Method: Stylet Placement Confirmation: ETT inserted through vocal cords under direct vision,  positive ETCO2 and breath sounds checked- equal and bilateral Secured at: 21 cm Tube secured with: Tape Dental Injury: Teeth and Oropharynx as per pre-operative assessment

## 2018-07-13 NOTE — H&P (Signed)
Date of Admission:  07/13/2018  Reason for Admission:  Strangulated recurrent right inguinal hernia  History of Present Illness: Victor Ramos is a 77 y.o. male who presents to the hospital with a recurrent right inguinal hernia.  The patient reports 4 prior right inguinal hernia repair through the years.  He says that about 3 days ago he developed a bulge in the right groin and has worsened over the weekend.  It has become more prominent and more tender and tense.  He denies any abdominal pain, fever, chills, chest pain, or shortness of breath.  Denies any nausea or vomiting but is constipated and has not had a bowel movement in three days either.    He presented to the ED today and had labs and CT scan which showed a right inguinal hernia containing small bowel with the tip of the bladder dome extending into it too.  There is concern for strangulation based on the amount of inflammation and fluid.  His creatinine is elevated and he's hemoconcentrated.  He has atrial fibrillation and takes Pradaxa for it.  Past Medical History: Past Medical History:  Diagnosis Date  . Atrial fibrillation ([place])   . BPH (benign prostatic hyperplasia)   . Hypertension      Past Surgical History: Past Surgical History:  Procedure Laterality Date  . CORONARY ANGIOPLASTY WITH STENT PLACEMENT    . HERNIA REPAIR    . JOINT REPLACEMENT      Home Medications: Prior to Admission medications   Medication Sig Start Date End Date Taking? Authorizing Provider  amLODipine (NORVASC) 10 MG tablet Take 10 mg by mouth daily.   Yes [provider]  aspirin 81 MG tablet Take 81 mg by mouth daily.   Yes [provider]  budesonide-formoterol (SYMBICORT) 160-4.5 MCG/ACT inhaler Inhale 2 puffs into the lungs 2 (two) times daily.   Yes [provider]  dabigatran (PRADAXA) 150 MG CAPS capsule Take 150 mg by mouth 2 (two) times daily.   Yes [provider]  finasteride (PROSCAR) 5 MG  tablet Take 5 mg by mouth daily.   Yes [provider]  lisinopril (PRINIVIL,ZESTRIL) 40 MG tablet Take 40 mg by mouth daily.   Yes [provider]  magnesium 30 MG tablet Take 250 mg by mouth daily.    Yes [provider]  Melatonin 5 MG TABS Take 1 tablet by mouth at bedtime.    Yes [provider]  metoprolol succinate (TOPROL-XL) 25 MG 24 hr tablet Take 25 mg by mouth 2 (two) times daily.   Yes [provider]  potassium chloride (K-DUR,KLOR-CON) 10 MEQ tablet Take 10 mEq by mouth daily.    Yes [provider]  pravastatin (PRAVACHOL) 40 MG tablet Take 20 mg by mouth daily.   Yes [provider]  tamsulosin (FLOMAX) 0.4 MG CAPS capsule Take 0.4 mg by mouth daily.    Yes [provider]  tiotropium (SPIRIVA) 18 MCG inhalation capsule Place 18 mcg into inhaler and inhale daily.   Yes [provider]    Allergies: No Known Allergies  Social History:  reports that he has quit smoking. He has never used smokeless tobacco. He reports that he drinks alcohol. He reports that he does not use drugs.   Family History: Family History  Problem Relation Age of Onset  . Cancer Mother   . Heart attack Father   . Cancer Brother     Review of Systems: Review of Systems  Constitutional:  Negative for chills and fever.  HENT: Negative for hearing loss.   Eyes: Negative for blurred vision.  Respiratory: Negative for shortness of breath.   Cardiovascular: Negative for chest pain.  Gastrointestinal: Positive for abdominal pain (right groin pain) and constipation. Negative for nausea and vomiting.  Genitourinary: Negative for dysuria.  Musculoskeletal: Negative for myalgias.  Skin: Negative for rash.  Neurological: Negative for dizziness.  Psychiatric/Behavioral: Negative for depression.    Physical Exam BP (!) 131/93 (BP Location: Left Arm)   Pulse (!) 117   Temp 97.6 F (36.4 C) (Oral)   Resp 20   Ht 5\' 10"   (1.778 m)   Wt 91.6 kg   SpO2 97%   BMI 28.98 kg/m  CONSTITUTIONAL: No acute distress HEENT:  Normocephalic, atraumatic, extraocular motion intact. NECK: Trachea is midline, and there is no jugular venous distension.  RESPIRATORY:  Lungs are clear, and breath sounds are equal bilaterally. Normal respiratory effort without pathologic use of accessory muscles. CARDIOVASCULAR: Heart is regular without murmurs, gallops, or rubs. GI: The abdomen is soft, obese, nondistended, nontender to palpation.  He has a right inguinal hernia which is not reducible and is tender to manipulation.  No skin color changes except for mild erythema at a crease but does not appear related to the hernia. MUSCULOSKELETAL:  Normal muscle strength and tone in all four extremities.  No peripheral edema or cyanosis. SKIN: Skin turgor is normal. There are no pathologic skin lesions.  NEUROLOGIC:  Motor and sensation is grossly normal.  Cranial nerves are grossly intact. PSYCH:  Alert and oriented to person, place and time. Affect is normal.  Laboratory Analysis: Results for orders placed or performed during the hospital encounter of 07/13/18 (from the past 24 hour(s))  Basic metabolic panel     Status: Abnormal   Collection Time: 07/13/18  9:21 AM  Result Value Ref Range   Sodium 136 135 - 145 mmol/L   Potassium 4.0 3.5 - 5.1 mmol/L   Chloride 93 (L) 98 - 111 mmol/L   CO2 30 22 - 32 mmol/L   Glucose, Bld 144 (H) 70 - 99 mg/dL   BUN 37 (H) 8 - 23 mg/dL   Creatinine, Ser 2.09 (H) 0.61 - 1.24 mg/dL   Calcium 10.7 (H) 8.9 - 10.3 mg/dL   GFR calc non Af Amer 30 (L) >60 mL/min   GFR calc Af Amer 34 (L) >60 mL/min   Anion gap 13 5 - 15  CBC     Status: Abnormal   Collection Time: 07/13/18  9:21 AM  Result Value Ref Range   WBC 24.8 (H) 3.8 - 10.6 K/uL   RBC 5.54 4.40 - 5.90 MIL/uL   Hemoglobin 17.7 13.0 - 18.0 g/dL   HCT 50.2 40.0 - 52.0 %   MCV 90.7 80.0 - 100.0 fL   MCH 32.0 26.0 - 34.0 pg   MCHC 35.2 32.0 -  36.0 g/dL   RDW 13.6 11.5 - 14.5 %   Platelets 200 150 - 440 K/uL  Glucose, capillary     Status: Abnormal   Collection Time: 07/13/18  9:35 AM  Result Value Ref Range   Glucose-Capillary 140 (H) 70 - 99 mg/dL    Imaging: Ct Abdomen Pelvis Wo Contrast  Result Date: 07/13/2018 CLINICAL DATA:  77 year old male noted hernia 1 week ago becoming larger over the weekend and painful. Initial encounter. EXAM: CT ABDOMEN AND PELVIS WITHOUT CONTRAST TECHNIQUE: Multidetector CT imaging of the abdomen and pelvis was performed following the  standard protocol without IV contrast. COMPARISON:  None. FINDINGS: Lower chest: Basilar subsegmental atelectasis/scarring. Heart size within normal limits. Prominent coronary artery calcifications. Aortic valve calcifications. Trace pericardial fluid. Hepatobiliary: Taking into account limitation by non contrast imaging, no worrisome hepatic lesion. No calcified gallstones. Pancreas: Taking into account limitation by non contrast imaging, no worrisome pancreatic mass or inflammation. Spleen: Taking into account limitation by non contrast imaging, no worrisome splenic mass which is top-normal in length. Adrenals/Urinary Tract: No obstructing stone or hydronephrosis. Taking into account limitation by non contrast imaging, no worrisome renal mass. Bilateral adrenal low-density structures most suggestive of small adenomas slightly larger on the left measuring up to 2 cm. Dome of bladder extends towards the entrance of the hernia. Bladder decompressed. Stomach/Bowel: Right inguinal canal distal small bowel containing hernia appears markedly inflamed with mixed density surrounding fluid (possible associated hemorrhage) and infiltration of surrounding fat planes. This is causing proximal obstruction. Finding consistent with incarcerated hernia. Cannot determine viability of bowel. No pneumatosis, free air or venous gas currently detected. Fat and vessels extend into this hernia. Prior  hernia surgery with clips seen along the medial aspect of the hernia. Dome of bladder extends towards the entrance of the hernia. Vascular/Lymphatic: Atherosclerotic changes aorta and aortic branch vessels. No aortic aneurysm noted. Scattered normal size lymph nodes. Reproductive: Slightly prominent size prostate gland. Other: No free intraperitoneal air. Musculoskeletal: Moderate degenerative changes lower thoracic and lower lumbar spine. Hip joint degenerative changes. Nonspecific osseous lesion left inferior aspect of the L3 vertebra. Smaller nonspecific lesion right ilium. IMPRESSION: 1. Right inguinal canal distal small bowel containing hernia appears markedly inflamed with mixed density surrounding fluid (possible associated hemorrhage) and infiltration of surrounding fat planes. This is causing proximal obstruction. Findings consistent with incarcerated hernia. Cannot determine viability of bowel. No pneumatosis, free air or venous gas currently detected. Fat and vessels extend into this hernia. Prior hernia surgery with clips seen along the medial aspect of the hernia. Dome of bladder extends towards the entrance of the hernia. 2. Bilateral adrenal low-density structures most suggestive of small adenomas slightly larger on the left measuring up to 2 cm. 3. Aortic Atherosclerosis (ICD10-I70.0). Atherosclerotic changes aorta, aortic branch vessels, iliac arteries and femoral arteries. No abdominal aortic aneurysm. 4. Prominent coronary artery calcifications. 5. Slightly enlarged prostate gland. 6. Moderate degenerative changes lower thoracic and lower lumbar spine. Hip joint degenerative changes. 7. Nonspecific osseous lesion left inferior aspect of the L3 vertebra. Smaller nonspecific lesion right ilium. These results were called by telephone at the time of interpretation on 07/13/2018 at 11:10 am to Dr. Lavonia Drafts , who verbally acknowledged these results. Electronically Signed   By: Genia Del M.D.    On: 07/13/2018 11:27    Assessment and Plan: This is a 77 y.o. male with likely strangulated right inguinal hernia, recurrent with 4 prior surgeries.    Discussed with the patient that we need to take him to the OR.  Risk of bleeding, infection, and injury to surrounding structures, and need for further procedures were discussed.  Unfortunately his hernia is not reducible and with the amount of inflammation on CT scan and his elevated WBC of 22, my concern is that his bowel in the hernia is not healthy and possibly not viable.  Discussed with the patient that we would start with a groin approach and assess the bowel and may need a formal laparotomy in the midline if we're unable to safely resect bowel from the groin.  Likely we  would be unable to use standard mesh for repair, and given his 4 prior surgeries, it would be more difficult to determine the appropriate layers for a tissue repair.  He may require biologic mesh to temporize the hernia.  Discussed also that his hernia is causing a bowel obstruction and he needs an NG tube which would stay in place until return of bowel function.  Given his atrial fibrillation and Pradaxa, he will get Praxbind to reverse him and also a dose of Zosyn for surgery.  Patient understands this plan and he is willing to proceed to surgery.  Dr. Dahlia Byes will assist.  He will go to OR urgently.   Melvyn Neth, MD [facility] Pg:  [phone]

## 2018-07-13 NOTE — ED Provider Notes (Signed)
[facility] Emergency Department Provider Note   ____________________________________________    I have reviewed the triage vital signs and the nursing notes.   HISTORY  Chief Complaint Hernia     HPI Victor Ramos is a 77 y.o. male with a history of atrial fibrillation on Pradaxa who presents with complaints of fatigue/weakness as well as a new hernia in his right groin.  Patient reports he noticed bulging in his right groin 2 days ago, has been unable to reduce it.  Has a history of hernias in the past, repaired many many years ago.  He states hernia is painful with palpation but otherwise does not bother him.  Denies fevers or chills.  No bowel movement x2 days.  No nausea or vomiting.   Past Medical History:  Diagnosis Date  . Atrial fibrillation ([place])   . BPH (benign prostatic hyperplasia)   . Hypertension     There are no active problems to display for this patient.   Past Surgical History:  Procedure Laterality Date  . CORONARY ANGIOPLASTY WITH STENT PLACEMENT    . HERNIA REPAIR    . JOINT REPLACEMENT      Prior to Admission medications   Medication Sig Start Date End Date Taking? Authorizing Provider  amLODipine (NORVASC) 10 MG tablet Take 10 mg by mouth daily.   Yes [provider]  aspirin 81 MG tablet Take 81 mg by mouth daily.   Yes [provider]  budesonide-formoterol (SYMBICORT) 160-4.5 MCG/ACT inhaler Inhale 2 puffs into the lungs 2 (two) times daily.   Yes [provider]  dabigatran (PRADAXA) 150 MG CAPS capsule Take 150 mg by mouth 2 (two) times daily.   Yes [provider]  finasteride (PROSCAR) 5 MG tablet Take 5 mg by mouth daily.   Yes [provider]  lisinopril (PRINIVIL,ZESTRIL) 40 MG tablet Take 40 mg by mouth daily.   Yes [provider]  magnesium 30 MG tablet Take 250 mg by mouth daily.    Yes [provider]  Melatonin 5 MG TABS Take 1 tablet by  mouth at bedtime.    Yes [provider]  metoprolol succinate (TOPROL-XL) 25 MG 24 hr tablet Take 25 mg by mouth 2 (two) times daily.   Yes [provider]  potassium chloride (K-DUR,KLOR-CON) 10 MEQ tablet Take 10 mEq by mouth daily.    Yes [provider]  pravastatin (PRAVACHOL) 40 MG tablet Take 20 mg by mouth daily.   Yes [provider]  tamsulosin (FLOMAX) 0.4 MG CAPS capsule Take 0.4 mg by mouth daily.    Yes [provider]  tiotropium (SPIRIVA) 18 MCG inhalation capsule Place 18 mcg into inhaler and inhale daily.   Yes [provider]  HYDROcodone-acetaminophen (NORCO/VICODIN) 5-325 MG tablet Take 1 tablet by mouth every 4 (four) hours as needed for moderate pain. Patient not taking: Reported on 07/13/2018 09/27/16   Johnn Hai, PA-C     Allergies Patient has no known allergies.  Family History  Problem Relation Age of Onset  . Cancer Mother   . Heart attack Father   . Cancer Brother     Social History Social History   Tobacco Use  . Smoking status: Former Research scientist (life sciences)  . Smokeless tobacco: Never Used  Substance Use Topics  . Alcohol use: Yes    Comment: rarely  . Drug use: No    Review of Systems  Constitutional: No fever/chills Eyes: No visual changes.  ENT: No sore throat. Cardiovascular: Denies chest pain. Respiratory: Denies shortness of breath. Gastrointestinal: As above Genitourinary: As above Musculoskeletal: Negative for back pain. Skin: Negative for rash. Neurological: Negative for headaches or weakness   ____________________________________________   PHYSICAL EXAM:  VITAL SIGNS: ED Triage Vitals  Enc Vitals Group     BP 07/13/18 0912 119/89     Pulse Rate 07/13/18 0912 74     Resp 07/13/18 0912 18     Temp 07/13/18 0912 97.6 F (36.4 C)     Temp Source 07/13/18 0912 Oral     SpO2 07/13/18 0912 98 %     Weight 07/13/18 0913 91.6 kg (202 lb)     Height 07/13/18 0913 1.778 m (5\' 10" )       Head Circumference --      Peak Flow --      Pain Score 07/13/18 0912 3     Pain Loc --      Pain Edu? --      Excl. in [place]? --     Constitutional: Alert and oriented.  Eyes: Conjunctivae are normal.  Head: Atraumatic. Nose: No congestion/rhinnorhea. Mouth/Throat: Mucous membranes are moist.   Neck:  Painless ROM Cardiovascular: Tachycardia, irregularly irregular rhythm grossly normal heart sounds.  Good peripheral circulation. Respiratory: Normal respiratory effort.  No retractions. Lungs CTAB. Gastrointestinal: Soft and nontender. No distention.   Genitourinary: Right groin, large tense hernia, unable to reduce, some redness noted Musculoskeletal: No lower extremity tenderness nor edema.  Warm and well perfused Neurologic:  Normal speech and language. No gross focal neurologic deficits are appreciated.  Skin:  Skin is warm, dry and intact. No rash noted. Psychiatric: Mood and affect are normal. Speech and behavior are normal.  ____________________________________________   LABS (all labs ordered are listed, but only abnormal results are displayed)  Labs Reviewed  BASIC METABOLIC PANEL - Abnormal; Notable for the following components:      Result Value   Chloride 93 (*)    Glucose, Bld 144 (*)    BUN 37 (*)    Creatinine, Ser 2.09 (*)    Calcium 10.7 (*)    GFR calc non Af Amer 30 (*)    GFR calc Af Amer 34 (*)    All other components within normal limits  CBC - Abnormal; Notable for the following components:   WBC 24.8 (*)    All other components within normal limits  GLUCOSE, CAPILLARY - Abnormal; Notable for the following components:   Glucose-Capillary 140 (*)    All other components within normal limits  URINALYSIS, COMPLETE (UACMP) WITH MICROSCOPIC   ____________________________________________  EKG  ED ECG REPORT I, Lavonia Drafts, the attending physician, personally viewed and interpreted this ECG.  Date: 07/13/2018  Rhythm: Atrial fibrillation QRS  Axis: normal Intervals: normal ST/T Wave abnormalities: None specific changes Narrative Interpretation: no evidence of acute ischemia  ____________________________________________  RADIOLOGY  Contacted by radiologist and notified of findings consistent with incarcerated hernia ____________________________________________   PROCEDURES  Procedure(s) performed: No  Procedures   Critical Care performed: yes  CRITICAL CARE Performed by: Lavonia Drafts   Total critical care time: 30 minutes  Critical care time was exclusive of separately billable procedures and treating other patients.  Critical care was necessary to treat or prevent imminent or life-threatening deterioration.  Critical care was time spent personally by me on the following activities: development of treatment plan with patient and/or surrogate as well as nursing, discussions with consultants, evaluation of patient's response  to treatment, examination of patient, obtaining history from patient or surrogate, ordering and performing treatments and interventions, ordering and review of laboratory studies, ordering and review of radiographic studies, pulse oximetry and re-evaluation of patient's condition.  ____________________________________________   INITIAL IMPRESSION / ASSESSMENT AND PLAN / ED COURSE  Pertinent labs & imaging results that were available during my care of the patient were reviewed by me and considered in my medical decision making (see chart for details).  Patient presents with large tense groin hernia, I am unable to reduce it.  I am concerned about a incarcerated hernia.  Afebrile but mildly tachycardic, history of A. fib on Pradaxa, did not take it last night or today.  Will obtain CT imaging, labs, patient does not request pain medication at this time  Called by radiologist and notified of incarcerated hernia based on CT scan, this is correlated clinically.  Discussed with Dr. Hampton Abbot of  surgery he will see the patient in the ED. noted elevated white blood cell count of 24,000  ----------------------------------------- 11:53 AM on 07/13/2018 -----------------------------------------  Dr. Hampton Abbot has seen the patient and has requested that I order Praxbind as well as Zosyn, patient will go to the operating room    ____________________________________________   FINAL CLINICAL IMPRESSION(S) / ED DIAGNOSES  Final diagnoses:  Incarcerated hernia        Note:  This document was prepared using Dragon voice recognition software and may include unintentional dictation errors.    Lavonia Drafts, MD 07/13/18 1153

## 2018-07-13 NOTE — Plan of Care (Signed)
Back to bed with assist,reminded of NG tube whereas continues to pick around tube"not going to pull out tube" Will continue to follow regimen.Callbell within reach.Sitter at bedside for safety.

## 2018-07-13 NOTE — Op Note (Signed)
Procedure Date:  07/13/2018  Pre-operative Diagnosis:  Strangulated recurrent right inguinal hernia  Post-operative Diagnosis:  Strangulated recurrent right inguinal hernia with necrotic segment of small bowel  Procedure:  Right recurrent inguinal Hernia Repair, small bowel resection.  Surgeon:  Melvyn Neth, MD  Assistant:  Caroleen Hamman, MD, Otho Ket, PA-C.  Their assistance was critical due to the complexity of the case with concern for necrotic bowel and 4 prior hernia surgeries.    Anesthesia:  General endotracheal  Estimated Blood Loss:  50 ml  Specimens:  Hernia sac, small bowel segment  Complications:  None  Indications for Procedure:  This is a 77 y.o. male who presents with a strangulated recurrent right inguinal hernia.  The options of surgery were reviewed with the patient and/or family. The risks of bleeding, abscess or infection, recurrence of symptoms, potential for an open procedure, injury to surrounding structures, and chronic pain were all discussed with the patient and was willing to proceed.  Description of Procedure: The patient was correctly identified in the preoperative area and brought into the operating room.  The patient was placed supine with VTE prophylaxis in place.  Appropriate time-outs were performed.  Anesthesia was induced and the patient was intubated.  Foley catheter was placed.  Appropriate antibiotics were infused.  The abdomen and right groin were prepped and draped in a sterile fashion. An oblique incision was made between the pubic symphysis extending laterally toward the ASIS. Using electrocautery, the subcutaneous tissues were dissected, assuring adequate hemostasis, until reaching the hernia contents.  The hernia sac was very tight, and the hernia defect itself was extended medially and laterally to get more space.  At that point, the hernia sac was entered and resected and sent to pathology.  This revealed omentum, mesentery, and a  segment of small bowel.  There was some hemorrhagic fluid within the hernia sac from the strangulation.  The omentum appeared healthy with some ecchymosis, the mesentery was congested and inflamed, and the small bowel segment was necrotic.  The omentum was pushed back into the abdominal cavity without complications.  The small bowel was retracted out further and this revealed a segment of about 20 cm that was necrotic with otherwise healthy bowel.  There was enough length to allow for bowel resection and anastomosis from the inguinal incision.  Windows in the mesentery were made and two 75 mm blue loads GIA stapler were used to resect that bowel.  LigaSure was used the cauterize the mesentery and the specimen was removed from the field.    3-0 Silk suture was used to line up the antimesenteric edges of small bowel.  Corners of the staple lines were cut using mayo scissors and another 75 mm blue load was fired creating a common channel.  The edges were lined up with Allis clamps and another 75 mm blue load was fired to close the common channel, completing the anastomosis.  3-0 Silk sutures were used to imbricate the staple line.  The bowel was pushed back into the abdomen.  The hernia defect was closed primarily using 0 Ethibond sutures approximating inguinal ligament shelving edge and conjoined tendon in two layers.  A 15 Fr Blake drain was placed between the layers, coming out laterally.  60 ml of Exparel solution was infiltrated to tissues.  The wound was then closed in layers using 0 Vicryl for subcutaneous tissue and staples for the skin.  The drain was secured with 3-0 Nylon.  The wound was cleaned and  dressed with honeycomb dressing and 4x4 gauze and tegaderm for the drain.  The patient was emerged from anesthesia and extubated and brought to the recovery room for further management.  The patient tolerated the procedure well and all counts were correct at the end of the case.   Melvyn Neth,  MD

## 2018-07-13 NOTE — Anesthesia Preprocedure Evaluation (Addendum)
Anesthesia Evaluation  Patient identified by MRN, date of birth, ID band Patient awake    Reviewed: Allergy & Precautions, H&P , NPO status , Patient's Chart, lab work & pertinent test results  History of Anesthesia Complications Negative for: history of anesthetic complications  Airway Mallampati: III  TM Distance: <3 FB Neck ROM: limited    Dental  (+) Chipped, Poor Dentition, Missing   Pulmonary neg shortness of breath, former smoker,           Cardiovascular Exercise Tolerance: Good hypertension, (-) angina+ CAD  (-) Past MI and (-) DOE + dysrhythmias Atrial Fibrillation      Neuro/Psych negative neurological ROS  negative psych ROS   GI/Hepatic negative GI ROS, Neg liver ROS, neg GERD  ,  Endo/Other  negative endocrine ROS  Renal/GU      Musculoskeletal   Abdominal   Peds  Hematology negative hematology ROS (+)   Anesthesia Other Findings Past Medical History: No date: Atrial fibrillation (HCC) No date: BPH (benign prostatic hyperplasia) No date: Hypertension  Past Surgical History: No date: CORONARY ANGIOPLASTY WITH STENT PLACEMENT No date: HERNIA REPAIR No date: JOINT REPLACEMENT  BMI    Body Mass Index:  28.98 kg/m      Reproductive/Obstetrics negative OB ROS                             Anesthesia Physical Anesthesia Plan  ASA: IV and emergent  Anesthesia Plan: General ETT, Modified Rapid Sequence and Cricoid Pressure   Post-op Pain Management:    Induction: Intravenous  PONV Risk Score and Plan: Ondansetron, Dexamethasone, Midazolam and Treatment may vary due to age or medical condition  Airway Management Planned: Oral ETT  Additional Equipment:   Intra-op Plan:   Post-operative Plan: Extubation in OR  Informed Consent: I have reviewed the patients History and Physical, chart, labs and discussed the procedure including the risks, benefits and  alternatives for the proposed anesthesia with the patient or authorized representative who has indicated his/her understanding and acceptance.   Dental Advisory Given  Plan Discussed with: Anesthesiologist, CRNA and Surgeon  Anesthesia Plan Comments: (Patient consented for risks of anesthesia including but not limited to:  - adverse reactions to medications - damage to teeth, lips or other oral mucosa - sore throat or hoarseness - Damage to heart, brain, lungs or loss of life  Patient voiced understanding.)        Anesthesia Quick Evaluation

## 2018-07-13 NOTE — ED Notes (Signed)
EDP is examining the pt hernia at this time. Pt states that he started feeling week once the hernia was noticed. Pts wife states that no BM since Friday. Pt has hx of chronic constipation.

## 2018-07-13 NOTE — Consult Note (Signed)
Pharmacy consulted to monitor s/p praxbind administration. Will order an APTT for 12hr and 24hr post administration per protocol  Ramond Dial, Pharm.D, BCPS Clinical Pharmacist

## 2018-07-13 NOTE — Progress Notes (Signed)
Pharmacy Antibiotic Note  Victor Ramos is a 77 y.o. male admitted on 07/13/2018 with intra abdominal infection.  Pharmacy has been consulted for Zosyn  dosing.  Plan:  Zosyn 3.375 gm IV X 1 given over 30 min on 9/16 @ 12:23.   Will continue Zosyn 3.375 gm IV Q8H EI on 9/16 @ 18:00.   Height: 5\' 10"  (177.8 cm) Weight: 200 lb 9.9 oz (91 kg) IBW/kg (Calculated) : 73  Temp (24hrs), Avg:97.4 F (36.3 C), Min:97.2 F (36.2 C), Max:97.6 F (36.4 C)  Recent Labs  Lab 07/13/18 0921  WBC 24.8*  CREATININE 2.09*    Estimated Creatinine Clearance: 35.2 mL/min (A) (by C-G formula based on SCr of 2.09 mg/dL (H)).    No Known Allergies  Antimicrobials this admission:   >>    >>   Dose adjustments this admission:   Microbiology results:  BCx:   UCx:    Sputum:    MRSA PCR:   Thank you for allowing pharmacy to be a part of this patient's care.  , D 07/13/2018 3:32 PM

## 2018-07-13 NOTE — ED Triage Notes (Addendum)
Patient states he noticed a hernia about 1 week ago, but it became much larger over the weekend and began to be painful.  Hernia is to right groin area.  Patient denies any discoloration.  Patient states he can't push it back in.  Patient's wife also states patient seems very weak x 2 days.  Patient has hiccups during triage and reports he had them all night long.

## 2018-07-14 ENCOUNTER — Encounter: Payer: Self-pay | Admitting: Surgery

## 2018-07-14 LAB — CBC
HCT: 41.9 % (ref 40.0–52.0)
Hemoglobin: 14.3 g/dL (ref 13.0–18.0)
MCH: 31.1 pg (ref 26.0–34.0)
MCHC: 34.1 g/dL (ref 32.0–36.0)
MCV: 91.1 fL (ref 80.0–100.0)
Platelets: 141 10*3/uL — ABNORMAL LOW (ref 150–440)
RBC: 4.6 MIL/uL (ref 4.40–5.90)
RDW: 13.4 % (ref 11.5–14.5)
WBC: 16.4 10*3/uL — AB (ref 3.8–10.6)

## 2018-07-14 LAB — APTT
aPTT: 30 seconds (ref 24–36)
aPTT: 32 seconds (ref 24–36)

## 2018-07-14 LAB — BASIC METABOLIC PANEL
ANION GAP: 7 (ref 5–15)
BUN: 39 mg/dL — ABNORMAL HIGH (ref 8–23)
CHLORIDE: 99 mmol/L (ref 98–111)
CO2: 30 mmol/L (ref 22–32)
Calcium: 8.6 mg/dL — ABNORMAL LOW (ref 8.9–10.3)
Creatinine, Ser: 1.71 mg/dL — ABNORMAL HIGH (ref 0.61–1.24)
GFR calc non Af Amer: 38 mL/min — ABNORMAL LOW (ref >60)
GFR, EST AFRICAN AMERICAN: 44 mL/min — AB (ref >60)
GLUCOSE: 141 mg/dL — AB (ref 70–99)
POTASSIUM: 3.9 mmol/L (ref 3.5–5.1)
Sodium: 136 mmol/L (ref 135–145)

## 2018-07-14 LAB — MAGNESIUM: MAGNESIUM: 2.1 mg/dL (ref 1.7–2.4)

## 2018-07-14 NOTE — Progress Notes (Signed)
   07/14/18 1000  Clinical Encounter Type  Visited With Patient and family together  Visit Type Initial;Spiritual support (Advance Directives education)  Recommendations Closed OR.  Spiritual Encounters  Spiritual Needs Emotional   Chaplain provided Advance Directives education, but patient could not answer basic screening questions (e.g. year and month). Wife says that he has memory "issues." Though the patient seemed confident that he did not want his life prolonged through life prolonging measures, Chaplain did not proceed due to concerns about his understanding. Patient believes that he has completed an AD and will find it at home. Wife disagrees. Chaplain gave the patient a booklet and recommended that they complete the AD document off-camp[employer] and return a copy to [facility] records for inclusive in the patient's chart.

## 2018-07-14 NOTE — Progress Notes (Signed)
PT Cancellation Note  Patient Details Name: Victor Ramos MRN: [id number] DOB: 11-04-1940   Cancelled Treatment:    Reason Eval/Treat Not Completed: PT screened, no needs identified, will sign off Pt initially reports "I don't really think I need you."  On further questioning he apparently got up and was able to easily circumambulate the nurses station w/ walker but no direct assist.  He has steps to enter home but has b/l rails as well as 24/7 assist if needed from wife and son.  CNA who walked with him present and corroborates the ease with which he walked.  Encouraged continued activity while hospitalized and to get back to his baseline once home.  Will complete PT orders, no further needs.   Kreg Shropshire, DPT 07/14/2018, 11:15 AM

## 2018-07-14 NOTE — Progress Notes (Signed)
[facility] Progress Note  1 Day Post-Op  Subjective: He is resting comfortably in bed. He feels his pain has improved with just mild soreness in the RLQ. No complaints of nausea. No fever, chills, CP, SOB. Having good UO. No flatus.  Objective: Vital signs in last 24 hours: Temp:  [97.2 F (36.2 C)-98.6 F (37 C)] 98.6 F (37 C) (09/17 0532) Pulse Rate:  [74-120] 86 (09/17 0532) Resp:  [14-20] 19 (09/17 0532) BP: (108-149)/(70-100) 125/81 (09/17 0532) SpO2:  [89 %-99 %] 94 % (09/17 0532) Weight:  [91 kg-93.3 kg] 93.3 kg (09/17 0532) Last BM Date: 07/11/18  Intake/Output from previous day: 09/16 0701 - 09/17 0700 In: 4142.9 [I.V.:4034.2; IV Piggyback:108.7] Out: 1665 [Urine:1025; Emesis/NG output:550; Drains:40; Blood:50] Intake/Output this shift: No intake/output data recorded.  PE: Gen:  Alert, NAD, pleasant HEENT: NG in place Card:  Regular rate and rhythm, pedal pulses 2+ BL Pulm:  Normal effort, clear to auscultation bilaterally Abd: Soft, tenderness in RLQ, non-distended, JP drain in RLQ with serosanguinous fluid present, right inguinal incision CDI with honeycomb dressing in place.  Skin: warm and dry, no rashes  Psych: A&Ox3   Lab Results:  Recent Labs    07/13/18 0921 07/14/18 0010  WBC 24.8* 16.4*  HGB 17.7 14.3  HCT 50.2 41.9  PLT 200 141*   BMET Recent Labs    07/13/18 0921 07/14/18 0010  NA 136 136  K 4.0 3.9  CL 93* 99  CO2 30 30  GLUCOSE 144* 141*  BUN 37* 39*  CREATININE 2.09* 1.71*  CALCIUM 10.7* 8.6*   PT/INR No results for input(s): LABPROT, INR in the last 72 hours. CMP     Component Value Date/Time   NA 136 07/14/2018 0010   NA 142 03/24/2013 0403   K 3.9 07/14/2018 0010   K 3.7 03/24/2013 0403   CL 99 07/14/2018 0010   CL 108 (H) 03/24/2013 0403   CO2 30 07/14/2018 0010   CO2 29 03/24/2013 0403   GLUCOSE 141 (H) 07/14/2018 0010   GLUCOSE 104 (H) 03/24/2013 0403   BUN 39 (H) 07/14/2018 0010   BUN 17  03/24/2013 0403   CREATININE 1.71 (H) 07/14/2018 0010   CREATININE 1.23 03/24/2013 0403   CALCIUM 8.6 (L) 07/14/2018 0010   CALCIUM 7.8 (L) 03/24/2013 0403   GFRNONAA 38 (L) 07/14/2018 0010   GFRNONAA 60 (L) 03/24/2013 0403   GFRAA 44 (L) 07/14/2018 0010   GFRAA >60 03/24/2013 0403   Lipase  No results found for: LIPASE     Studies/Results: Ct Abdomen Pelvis Wo Contrast  Result Date: 07/13/2018 CLINICAL DATA:  77 year old male noted hernia 1 week ago becoming larger over the weekend and painful. Initial encounter. EXAM: CT ABDOMEN AND PELVIS WITHOUT CONTRAST TECHNIQUE: Multidetector CT imaging of the abdomen and pelvis was performed following the standard protocol without IV contrast. COMPARISON:  None. FINDINGS: Lower chest: Basilar subsegmental atelectasis/scarring. Heart size within normal limits. Prominent coronary artery calcifications. Aortic valve calcifications. Trace pericardial fluid. Hepatobiliary: Taking into account limitation by non contrast imaging, no worrisome hepatic lesion. No calcified gallstones. Pancreas: Taking into account limitation by non contrast imaging, no worrisome pancreatic mass or inflammation. Spleen: Taking into account limitation by non contrast imaging, no worrisome splenic mass which is top-normal in length. Adrenals/Urinary Tract: No obstructing stone or hydronephrosis. Taking into account limitation by non contrast imaging, no worrisome renal mass. Bilateral adrenal low-density structures most suggestive of small adenomas slightly larger on the left measuring up  to 2 cm. Dome of bladder extends towards the entrance of the hernia. Bladder decompressed. Stomach/Bowel: Right inguinal canal distal small bowel containing hernia appears markedly inflamed with mixed density surrounding fluid (possible associated hemorrhage) and infiltration of surrounding fat planes. This is causing proximal obstruction. Finding consistent with incarcerated hernia. Cannot determine  viability of bowel. No pneumatosis, free air or venous gas currently detected. Fat and vessels extend into this hernia. Prior hernia surgery with clips seen along the medial aspect of the hernia. Dome of bladder extends towards the entrance of the hernia. Vascular/Lymphatic: Atherosclerotic changes aorta and aortic branch vessels. No aortic aneurysm noted. Scattered normal size lymph nodes. Reproductive: Slightly prominent size prostate gland. Other: No free intraperitoneal air. Musculoskeletal: Moderate degenerative changes lower thoracic and lower lumbar spine. Hip joint degenerative changes. Nonspecific osseous lesion left inferior aspect of the L3 vertebra. Smaller nonspecific lesion right ilium. IMPRESSION: 1. Right inguinal canal distal small bowel containing hernia appears markedly inflamed with mixed density surrounding fluid (possible associated hemorrhage) and infiltration of surrounding fat planes. This is causing proximal obstruction. Findings consistent with incarcerated hernia. Cannot determine viability of bowel. No pneumatosis, free air or venous gas currently detected. Fat and vessels extend into this hernia. Prior hernia surgery with clips seen along the medial aspect of the hernia. Dome of bladder extends towards the entrance of the hernia. 2. Bilateral adrenal low-density structures most suggestive of small adenomas slightly larger on the left measuring up to 2 cm. 3. Aortic Atherosclerosis (ICD10-I70.0). Atherosclerotic changes aorta, aortic branch vessels, iliac arteries and femoral arteries. No abdominal aortic aneurysm. 4. Prominent coronary artery calcifications. 5. Slightly enlarged prostate gland. 6. Moderate degenerative changes lower thoracic and lower lumbar spine. Hip joint degenerative changes. 7. Nonspecific osseous lesion left inferior aspect of the L3 vertebra. Smaller nonspecific lesion right ilium. These results were called by telephone at the time of interpretation on 07/13/2018  at 11:10 am to Dr. Lavonia Drafts , who verbally acknowledged these results. Electronically Signed   By: Genia Del M.D.   On: 07/13/2018 11:27    Anti-infectives: Anti-infectives (From admission, onward)   Start     Dose/Rate Route Frequency Ordered Stop   07/13/18 1800  piperacillin-tazobactam (ZOSYN) IVPB 3.375 g     3.375 g 12.5 mL/hr over 240 Minutes Intravenous Every 8 hours 07/13/18 1532     07/13/18 1200  piperacillin-tazobactam (ZOSYN) IVPB 3.375 g     3.375 g 100 mL/hr over 30 Minutes Intravenous  Once 07/13/18 1151 07/13/18 1704       Assessment/Plan  Strangulated Recurrent Right Inguinal Hernia - POD1, doing well with improvement in his pain - NG Tube will remain in place until he starts to show signs of bowel function - Remain NPO until showing signs of bowel function - sCr improved this morning, having good UO, will D/C Foley today. Continue to monitor renal function - WBC improved this morning, continue to monitor.  - Will decrease IVF to 100 ml/hr, continue for today - Continue IV ABx - Will mobilize with PT to prevent deconditioning.    LOS: 1 day    Edison Simon , PA-C [facility] 07/14/2018, 9:02 AM [phone] M-F: 7am - 4pm

## 2018-07-14 NOTE — Anesthesia Postprocedure Evaluation (Signed)
Anesthesia Post Note  Patient: Victor Ramos  Procedure(s) Performed: HERNIA REPAIR INGUINAL ADULT (Right ) SMALL BOWEL RESECTION  Patient location during evaluation: PACU Anesthesia Type: General Level of consciousness: awake and alert Pain management: pain level controlled Vital Signs Assessment: post-procedure vital signs reviewed and stable Respiratory status: spontaneous breathing, nonlabored ventilation, respiratory function stable and patient connected to nasal cannula oxygen Cardiovascular status: blood pressure returned to baseline and stable Postop Assessment: no apparent nausea or vomiting Anesthetic complications: no     Last Vitals:  Vitals:   07/14/18 0114 07/14/18 0532  BP: 139/70 125/81  Pulse: (!) 108 86  Resp: 18 19  Temp: 36.8 C 37 C  SpO2: (!) 89% 94%    Last Pain:  Vitals:   07/14/18 0114  TempSrc: Oral  PainSc:                  Precious Haws 

## 2018-07-15 LAB — BASIC METABOLIC PANEL
ANION GAP: 11 (ref 5–15)
BUN: 30 mg/dL — ABNORMAL HIGH (ref 8–23)
CALCIUM: 8.2 mg/dL — AB (ref 8.9–10.3)
CO2: 21 mmol/L — AB (ref 22–32)
CREATININE: 1.21 mg/dL (ref 0.61–1.24)
Chloride: 108 mmol/L (ref 98–111)
GFR calc Af Amer: >60 mL/min (ref >60)
GFR, EST NON AFRICAN AMERICAN: 57 mL/min — AB (ref >60)
Glucose, Bld: 83 mg/dL (ref 70–99)
Potassium: 3.6 mmol/L (ref 3.5–5.1)
Sodium: 140 mmol/L (ref 135–145)

## 2018-07-15 LAB — CBC
HEMATOCRIT: 38.2 % — AB (ref 40.0–52.0)
Hemoglobin: 13 g/dL (ref 13.0–18.0)
MCH: 31.7 pg (ref 26.0–34.0)
MCHC: 34.1 g/dL (ref 32.0–36.0)
MCV: 92.9 fL (ref 80.0–100.0)
PLATELETS: 128 10*3/uL — AB (ref 150–440)
RBC: 4.11 MIL/uL — ABNORMAL LOW (ref 4.40–5.90)
RDW: 13.4 % (ref 11.5–14.5)
WBC: 10.9 10*3/uL — AB (ref 3.8–10.6)

## 2018-07-15 MED ORDER — LORAZEPAM 2 MG/ML IJ SOLN
INTRAMUSCULAR | Status: AC
  Filled 2018-07-15: qty 1

## 2018-07-15 MED ORDER — FLUMAZENIL 0.5 MG/5ML IV SOLN: 0.2000 mg | INTRAVENOUS | Status: DC | PRN

## 2018-07-15 MED ORDER — VITAMIN B-1 100 MG PO TABS
100.0000 mg | ORAL_TABLET | Freq: Every day | ORAL | Status: DC
  Administered 2018-07-15 – 2018-07-17 (×3): 100 mg via ORAL
  Filled 2018-07-15 (×3): qty 1

## 2018-07-15 MED ORDER — ADULT MULTIVITAMIN W/MINERALS CH
1.0000 | ORAL_TABLET | Freq: Every day | ORAL | Status: DC
  Administered 2018-07-15 – 2018-07-17 (×3): 1 via ORAL
  Filled 2018-07-15 (×3): qty 1

## 2018-07-15 MED ORDER — THIAMINE HCL 100 MG/ML IJ SOLN: 100.0000 mg | Freq: Every day | INTRAMUSCULAR | Status: DC

## 2018-07-15 MED ORDER — LORAZEPAM 2 MG/ML IJ SOLN
0.0000 mg | Freq: Four times a day (QID) | INTRAMUSCULAR | Status: AC
  Administered 2018-07-15: 4 mg via INTRAVENOUS
  Administered 2018-07-15 – 2018-07-17 (×3): 2 mg via INTRAVENOUS
  Filled 2018-07-15 (×2): qty 1
  Filled 2018-07-15: qty 2

## 2018-07-15 MED ORDER — LORAZEPAM 2 MG/ML IJ SOLN: 0.0000 mg | Freq: Two times a day (BID) | INTRAMUSCULAR | Status: DC

## 2018-07-15 MED ORDER — LORAZEPAM 2 MG/ML IJ SOLN
1.0000 mg | Freq: Once | INTRAMUSCULAR | Status: AC
  Administered 2018-07-15: 1 mg via INTRAVENOUS

## 2018-07-15 MED ORDER — LORAZEPAM 1 MG PO TABS
1.0000 mg | ORAL_TABLET | Freq: Four times a day (QID) | ORAL | Status: DC | PRN
  Administered 2018-07-16 (×3): 1 mg via ORAL
  Filled 2018-07-15 (×3): qty 1

## 2018-07-15 MED ORDER — LORAZEPAM 2 MG/ML IJ SOLN: 1.0000 mg | Freq: Four times a day (QID) | INTRAMUSCULAR | Status: DC | PRN

## 2018-07-15 MED ORDER — FOLIC ACID 1 MG PO TABS
1.0000 mg | ORAL_TABLET | Freq: Every day | ORAL | Status: DC
  Administered 2018-07-15 – 2018-07-17 (×3): 1 mg via ORAL
  Filled 2018-07-15 (×3): qty 1

## 2018-07-15 NOTE — Progress Notes (Addendum)
Updated wife about situation. Asked if pt. drinks ETOH at home. She responded, "Yes,rum and coke". Reports 2-3 glasses a day. Will obtain CIWA order.

## 2018-07-15 NOTE — Progress Notes (Signed)
Sitting  Up in  Chair at bedside refused to return to bed,With security on both sides, ativan 2mg  IV administered without incident.

## 2018-07-15 NOTE — Progress Notes (Signed)
After being placed back to bed with four man assist,mittens placed on hands bilaterally for safety.Resting quietly in bed at present.security and sitter at bedside.

## 2018-07-15 NOTE — Progress Notes (Signed)
Standing in room with security present.Pulled out NG tube (MD made aware).Attempted to strike at staff" I am going home this is a cult".Spouse called to clam patient unsuccessful.Received order for ativan 1 mg IV but unable to administer at present.refuses to allow carenurse or other staff to perform

## 2018-07-15 NOTE — Progress Notes (Signed)
Nurse called to room by patient's wife and told that patient was hallucinating. Per wife " he was reaching in front of him as if he was grabbing at something and he said it was something blue there". Patient denied having hallucinations. Nurse will continue to monitor patient.

## 2018-07-15 NOTE — Progress Notes (Signed)
[facility] Progress Note  2 Days Post-Op  Subjective: Overnight patient became very agitated and pulled out his NG Tube. Patient's wife made staff aware overnight that the patient does drink 2-3 "rum and cokes" everyday. CIWA protocol was initiated.   This morning, patient is somewhat sedated in bed with wife and sitter at bedside. Not complaining of any pain, nausea, or emesis. No fever, chills.   Objective: Vital signs in last 24 hours: Temp:  [97.5 F (36.4 C)-98.7 F (37.1 C)] 97.5 F (36.4 C) (09/18 0755) Pulse Rate:  [80-104] 104 (09/18 0755) Resp:  [18-20] 20 (09/17 1943) BP: (116-147)/(75-84) 147/84 (09/18 0755) SpO2:  [94 %-98 %] 94 % (09/18 0755) Last BM Date: 07/12/18  Intake/Output from previous day: 09/17 0701 - 09/18 0700 In: 2585.3 [I.V.:1932.4; IV Piggyback:352.9] Out: 775 [Urine:745; Drains:30] Intake/Output this shift: Total I/O In: -  Out: 100 [Urine:100]  PE: Gen:  Some what sedated Card:  Regular rate and rhythm Pulm:  Normal effort, clear to auscultation bilaterally Abd: Soft, non-tender, non-distended, JP drain RLQ with serosanguinous fluid, right inguinal incision is CDI, honeycomb in place.  MSK: Mittens to upper extremities bilaterally Skin: warm and dry, no rashes  Psych: Disoriented to place and time.   Lab Results:  Recent Labs    07/13/18 0921 07/14/18 0010  WBC 24.8* 16.4*  HGB 17.7 14.3  HCT 50.2 41.9  PLT 200 141*   BMET Recent Labs    07/13/18 0921 07/14/18 0010  NA 136 136  K 4.0 3.9  CL 93* 99  CO2 30 30  GLUCOSE 144* 141*  BUN 37* 39*  CREATININE 2.09* 1.71*  CALCIUM 10.7* 8.6*   PT/INR No results for input(s): LABPROT, INR in the last 72 hours. CMP     Component Value Date/Time   NA 136 07/14/2018 0010   NA 142 03/24/2013 0403   K 3.9 07/14/2018 0010   K 3.7 03/24/2013 0403   CL 99 07/14/2018 0010   CL 108 (H) 03/24/2013 0403   CO2 30 07/14/2018 0010   CO2 29 03/24/2013 0403   GLUCOSE  141 (H) 07/14/2018 0010   GLUCOSE 104 (H) 03/24/2013 0403   BUN 39 (H) 07/14/2018 0010   BUN 17 03/24/2013 0403   CREATININE 1.71 (H) 07/14/2018 0010   CREATININE 1.23 03/24/2013 0403   CALCIUM 8.6 (L) 07/14/2018 0010   CALCIUM 7.8 (L) 03/24/2013 0403   GFRNONAA 38 (L) 07/14/2018 0010   GFRNONAA 60 (L) 03/24/2013 0403   GFRAA 44 (L) 07/14/2018 0010   GFRAA >60 03/24/2013 0403   Lipase  No results found for: LIPASE     Studies/Results: Ct Abdomen Pelvis Wo Contrast  Result Date: 07/13/2018 CLINICAL DATA:  77 year old male noted hernia 1 week ago becoming larger over the weekend and painful. Initial encounter. EXAM: CT ABDOMEN AND PELVIS WITHOUT CONTRAST TECHNIQUE: Multidetector CT imaging of the abdomen and pelvis was performed following the standard protocol without IV contrast. COMPARISON:  None. FINDINGS: Lower chest: Basilar subsegmental atelectasis/scarring. Heart size within normal limits. Prominent coronary artery calcifications. Aortic valve calcifications. Trace pericardial fluid. Hepatobiliary: Taking into account limitation by non contrast imaging, no worrisome hepatic lesion. No calcified gallstones. Pancreas: Taking into account limitation by non contrast imaging, no worrisome pancreatic mass or inflammation. Spleen: Taking into account limitation by non contrast imaging, no worrisome splenic mass which is top-normal in length. Adrenals/Urinary Tract: No obstructing stone or hydronephrosis. Taking into account limitation by non contrast imaging, no worrisome renal mass. Bilateral  adrenal low-density structures most suggestive of small adenomas slightly larger on the left measuring up to 2 cm. Dome of bladder extends towards the entrance of the hernia. Bladder decompressed. Stomach/Bowel: Right inguinal canal distal small bowel containing hernia appears markedly inflamed with mixed density surrounding fluid (possible associated hemorrhage) and infiltration of surrounding fat planes.  This is causing proximal obstruction. Finding consistent with incarcerated hernia. Cannot determine viability of bowel. No pneumatosis, free air or venous gas currently detected. Fat and vessels extend into this hernia. Prior hernia surgery with clips seen along the medial aspect of the hernia. Dome of bladder extends towards the entrance of the hernia. Vascular/Lymphatic: Atherosclerotic changes aorta and aortic branch vessels. No aortic aneurysm noted. Scattered normal size lymph nodes. Reproductive: Slightly prominent size prostate gland. Other: No free intraperitoneal air. Musculoskeletal: Moderate degenerative changes lower thoracic and lower lumbar spine. Hip joint degenerative changes. Nonspecific osseous lesion left inferior aspect of the L3 vertebra. Smaller nonspecific lesion right ilium. IMPRESSION: 1. Right inguinal canal distal small bowel containing hernia appears markedly inflamed with mixed density surrounding fluid (possible associated hemorrhage) and infiltration of surrounding fat planes. This is causing proximal obstruction. Findings consistent with incarcerated hernia. Cannot determine viability of bowel. No pneumatosis, free air or venous gas currently detected. Fat and vessels extend into this hernia. Prior hernia surgery with clips seen along the medial aspect of the hernia. Dome of bladder extends towards the entrance of the hernia. 2. Bilateral adrenal low-density structures most suggestive of small adenomas slightly larger on the left measuring up to 2 cm. 3. Aortic Atherosclerosis (ICD10-I70.0). Atherosclerotic changes aorta, aortic branch vessels, iliac arteries and femoral arteries. No abdominal aortic aneurysm. 4. Prominent coronary artery calcifications. 5. Slightly enlarged prostate gland. 6. Moderate degenerative changes lower thoracic and lower lumbar spine. Hip joint degenerative changes. 7. Nonspecific osseous lesion left inferior aspect of the L3 vertebra. Smaller nonspecific  lesion right ilium. These results were called by telephone at the time of interpretation on 07/13/2018 at 11:10 am to Dr. Lavonia Drafts , who verbally acknowledged these results. Electronically Signed   By: Genia Del M.D.   On: 07/13/2018 11:27    Anti-infectives: Anti-infectives (From admission, onward)   Start     Dose/Rate Route Frequency Ordered Stop   07/13/18 1800  piperacillin-tazobactam (ZOSYN) IVPB 3.375 g     3.375 g 12.5 mL/hr over 240 Minutes Intravenous Every 8 hours 07/13/18 1532     07/13/18 1200  piperacillin-tazobactam (ZOSYN) IVPB 3.375 g     3.375 g 100 mL/hr over 30 Minutes Intravenous  Once 07/13/18 1151 07/13/18 1704       Assessment/Plan   Strangulated Recurrent Right Inguinal Hernia - POD2, pain and nausea are well controlled at this time, however, he became very agitated overnight. CIWA protocol initiated given new findings of patient's alcohol history. Continue to monitor this.  - NG Tube pulled out overnight, will leave out for now.  - Trial on ice chips and sips of water this morning. Continue to monitor for bowel function.  - Unable to obtain labs overnight given agitation, will attempt to draw this morning to monitor CBC and renal function. Will follow. - Continue IVF, IV ABx - Mobilize as tolerates - DVT Prophylaxis   LOS: 2 days    Edison Simon , PA-C [facility] 07/15/2018, 9:03 AM [phone] M-F: 7am - 4pm

## 2018-07-15 NOTE — Plan of Care (Signed)
Continue with planned regimen,family at bedside

## 2018-07-15 NOTE — Progress Notes (Signed)
Pt. insisting on leaving. Disoriented to person, place and time. Pt. believes he is in a cult and we are keeping him here.  Extremely agitated despite attempts to deescalate.  Security in room. Concerned about patient leaving due to his disorientation. Talked with Dr. Peyton Najjar. Order for hospitalist consult to IVC for medical reasons. I talked with Dr. Arta Silence regarding situation. Will medically IVC patient. Wife is on her way to see if she can help. Pt. states, "If she is in on this cult, that will be the last of her.".

## 2018-07-16 MED ORDER — DABIGATRAN ETEXILATE MESYLATE 150 MG PO CAPS
150.0000 mg | ORAL_CAPSULE | Freq: Two times a day (BID) | ORAL | Status: DC
  Administered 2018-07-16 – 2018-07-17 (×3): 150 mg via ORAL
  Filled 2018-07-16 (×4): qty 1

## 2018-07-16 NOTE — Progress Notes (Signed)
[facility] Progress Note  3 Days Post-Op  Subjective: Resting comfortably in bed with safety sitter and son at bedside. No complaints of abdominal pain. Tolerated sips with meds and ice chips yesterday without nausea or emesis. +Flatus  Objective: Vital signs in last 24 hours: Temp:  [97.9 F (36.6 C)-98.9 F (37.2 C)] 98.9 F (37.2 C) (09/19 0333) Pulse Rate:  [86-120] 106 (09/19 0443) Resp:  [20-24] 24 (09/19 0443) BP: (135-161)/(90-93) 157/93 (09/19 0443) SpO2:  [92 %-97 %] 96 % (09/19 0443) Last BM Date: 07/10/18  Intake/Output from previous day: 09/18 0701 - 09/19 0700 In: 931.9 [I.V.:732.5; IV Piggyback:199.4] Out: 865 [Urine:865] Intake/Output this shift: Total I/O In: -  Out: 50 [Urine:50]  PE: Gen:  Alert, NAD Pulm:  Normal effort Abd: Soft, non-tender, non-distended, RLQ incision is CDI with surround ecchymosis but no erythema or drainage, LLQ JP Drain with minimal serosanguinous fluid MSK: [place] to bilateral extremities  Skin: warm and dry, no rashes  Psych: A&Ox3   Lab Results:  Recent Labs    07/14/18 0010 07/15/18 1107  WBC 16.4* 10.9*  HGB 14.3 13.0  HCT 41.9 38.2*  PLT 141* 128*   BMET Recent Labs    07/14/18 0010 07/15/18 1107  NA 136 140  K 3.9 3.6  CL 99 108  CO2 30 21*  GLUCOSE 141* 83  BUN 39* 30*  CREATININE 1.71* 1.21  CALCIUM 8.6* 8.2*   PT/INR No results for input(s): LABPROT, INR in the last 72 hours. CMP     Component Value Date/Time   NA 140 07/15/2018 1107   NA 142 03/24/2013 0403   K 3.6 07/15/2018 1107   K 3.7 03/24/2013 0403   CL 108 07/15/2018 1107   CL 108 (H) 03/24/2013 0403   CO2 21 (L) 07/15/2018 1107   CO2 29 03/24/2013 0403   GLUCOSE 83 07/15/2018 1107   GLUCOSE 104 (H) 03/24/2013 0403   BUN 30 (H) 07/15/2018 1107   BUN 17 03/24/2013 0403   CREATININE 1.21 07/15/2018 1107   CREATININE 1.23 03/24/2013 0403   CALCIUM 8.2 (L) 07/15/2018 1107   CALCIUM 7.8 (L) 03/24/2013 0403   GFRNONAA 57 (L) 07/15/2018 1107   GFRNONAA 60 (L) 03/24/2013 0403   GFRAA >60 07/15/2018 1107   GFRAA >60 03/24/2013 0403   Lipase  No results found for: LIPASE     Studies/Results: No results found.  Anti-infectives: Anti-infectives (From admission, onward)   Start     Dose/Rate Route Frequency Ordered Stop   07/13/18 1800  piperacillin-tazobactam (ZOSYN) IVPB 3.375 g     3.375 g 12.5 mL/hr over 240 Minutes Intravenous Every 8 hours 07/13/18 1532     07/13/18 1200  piperacillin-tazobactam (ZOSYN) IVPB 3.375 g     3.375 g 100 mL/hr over 30 Minutes Intravenous  Once 07/13/18 1151 07/13/18 1704       Assessment/Plan   Strangulated Recurrent Right Inguinal Hernia  - POD3, showing signs of clinical improvement this morning, no complaints of abdominal pain, nausea, or emesis. Is reporting flatus.  - Will advance to clear liquid diet this morning.  - Leukocytosis continues to improve, will follow to normal range.  - Decrease IVF to 50 ml/hr - Continue IV Abx - Continue CIWA - Mobilize as tolerates, may consider PT reassessment - DVT Prophylaxis, restarted at-home Pradaxa this morning.    LOS: 3 days     Edison Simon , PA-C  [facility] 07/16/2018, 8:22 AM [phone] M-F: 7am - 4pm

## 2018-07-16 NOTE — Care Management Important Message (Signed)
Copy of signed IM left with patient in room.  

## 2018-07-17 LAB — BASIC METABOLIC PANEL
Anion gap: 7 (ref 5–15)
BUN: 13 mg/dL (ref 8–23)
CALCIUM: 8.1 mg/dL — AB (ref 8.9–10.3)
CO2: 27 mmol/L (ref 22–32)
CREATININE: 0.95 mg/dL (ref 0.61–1.24)
Chloride: 107 mmol/L (ref 98–111)
Glucose, Bld: 124 mg/dL — ABNORMAL HIGH (ref 70–99)
Potassium: 3.1 mmol/L — ABNORMAL LOW (ref 3.5–5.1)
SODIUM: 141 mmol/L (ref 135–145)

## 2018-07-17 LAB — CBC
HCT: 35.4 % — ABNORMAL LOW (ref 40.0–52.0)
Hemoglobin: 12.3 g/dL — ABNORMAL LOW (ref 13.0–18.0)
MCH: 31.7 pg (ref 26.0–34.0)
MCHC: 34.6 g/dL (ref 32.0–36.0)
MCV: 91.6 fL (ref 80.0–100.0)
Platelets: 162 10*3/uL (ref 150–440)
RBC: 3.86 MIL/uL — ABNORMAL LOW (ref 4.40–5.90)
RDW: 13.1 % (ref 11.5–14.5)
WBC: 8.5 10*3/uL (ref 3.8–10.6)

## 2018-07-17 MED ORDER — FINASTERIDE 5 MG PO TABS
5.0000 mg | ORAL_TABLET | Freq: Every day | ORAL | Status: DC
  Administered 2018-07-17: 5 mg via ORAL
  Filled 2018-07-17: qty 1

## 2018-07-17 MED ORDER — FAMOTIDINE 20 MG PO TABS: 20.0000 mg | ORAL_TABLET | Freq: Two times a day (BID) | ORAL | Status: DC

## 2018-07-17 MED ORDER — ASPIRIN EC 81 MG PO TBEC
81.0000 mg | DELAYED_RELEASE_TABLET | Freq: Every day | ORAL | Status: DC
  Administered 2018-07-17: 81 mg via ORAL
  Filled 2018-07-17: qty 1

## 2018-07-17 MED ORDER — AMLODIPINE BESYLATE 10 MG PO TABS
10.0000 mg | ORAL_TABLET | Freq: Every day | ORAL | Status: DC
  Administered 2018-07-17: 10 mg via ORAL
  Filled 2018-07-17: qty 1

## 2018-07-17 MED ORDER — POTASSIUM CHLORIDE CRYS ER 20 MEQ PO TBCR
20.0000 meq | EXTENDED_RELEASE_TABLET | Freq: Two times a day (BID) | ORAL | Status: DC
  Administered 2018-07-17: 20 meq via ORAL
  Filled 2018-07-17: qty 1

## 2018-07-17 MED ORDER — LISINOPRIL 20 MG PO TABS
40.0000 mg | ORAL_TABLET | Freq: Every day | ORAL | Status: DC
  Administered 2018-07-17: 40 mg via ORAL
  Filled 2018-07-17: qty 2

## 2018-07-17 MED ORDER — TAMSULOSIN HCL 0.4 MG PO CAPS
0.4000 mg | ORAL_CAPSULE | Freq: Every day | ORAL | Status: DC
  Administered 2018-07-17: 0.4 mg via ORAL
  Filled 2018-07-17: qty 1

## 2018-07-17 MED ORDER — OXYCODONE HCL 5 MG PO TABS: 5.0000 mg | ORAL_TABLET | Freq: Four times a day (QID) | ORAL | 0 refills | Status: DC | PRN

## 2018-07-17 MED ORDER — METOPROLOL SUCCINATE ER 25 MG PO TB24
25.0000 mg | ORAL_TABLET | Freq: Two times a day (BID) | ORAL | Status: DC
  Administered 2018-07-17: 25 mg via ORAL
  Filled 2018-07-17: qty 1

## 2018-07-17 MED ORDER — AMOXICILLIN-POT CLAVULANATE 875-125 MG PO TABS: 1.0000 | ORAL_TABLET | Freq: Two times a day (BID) | ORAL | 0 refills | Status: AC

## 2018-07-17 NOTE — Progress Notes (Signed)
Verbal order from surgeon to remove IVC order at this time and pt is cleared for discharge.    [employer]

## 2018-07-17 NOTE — Progress Notes (Signed)
Patient discharge teaching given, including activity, diet, follow-up appoints, and medications. Patient  and pt.'s wife verbalized understanding of all discharge instructions. IV access and JP was d/c'd. Vitals are stable. Skin is intact except as charted in most recent assessments. Pt to be escorted out by NT, to be driven home by family.   [employer]

## 2018-07-17 NOTE — Progress Notes (Signed)
[facility] Progress Note  4 Days Post-Op  Subjective: Patient sleeping comfortably in bed with wife at bedside. No complaints of abdominal pain, nausea, or emesis. He was able to tolerate a clear liquid diet yesterday. Agitation has improved. Mobilizing with staff.   Objective: Vital signs in last 24 hours: Temp:  [98.1 F (36.7 C)-99.2 F (37.3 C)] 98.2 F (36.8 C) (09/20 0530) Pulse Rate:  [93-102] 93 (09/20 0530) Resp:  [20-22] 22 (09/20 0530) BP: (158-177)/(88-96) 163/89 (09/20 0530) SpO2:  [95 %-98 %] 95 % (09/20 0530) Last BM Date: 07/10/18  Intake/Output from previous day: 09/19 0701 - 09/20 0700 In: 1380 [P.O.:1380] Out: 1625 [Urine:1625] Intake/Output this shift: No intake/output data recorded.  PE: Gen:  sleeping, NAD, pleasant Abd: Soft, non-tender, non-distended, RLQ incision is healing well, CDI, without erythema. JP in RLQ with minimal serosanguinous drainage in bulb.  Skin: warm and dry, no rashes  Psych: A&Ox3   Lab Results:  Recent Labs    07/15/18 1107 07/17/18 0345  WBC 10.9* 8.5  HGB 13.0 12.3*  HCT 38.2* 35.4*  PLT 128* 162   BMET Recent Labs    07/15/18 1107 07/17/18 0345  NA 140 141  K 3.6 3.1*  CL 108 107  CO2 21* 27  GLUCOSE 83 124*  BUN 30* 13  CREATININE 1.21 0.95  CALCIUM 8.2* 8.1*   PT/INR No results for input(s): LABPROT, INR in the last 72 hours. CMP     Component Value Date/Time   NA 141 07/17/2018 0345   NA 142 03/24/2013 0403   K 3.1 (L) 07/17/2018 0345   K 3.7 03/24/2013 0403   CL 107 07/17/2018 0345   CL 108 (H) 03/24/2013 0403   CO2 27 07/17/2018 0345   CO2 29 03/24/2013 0403   GLUCOSE 124 (H) 07/17/2018 0345   GLUCOSE 104 (H) 03/24/2013 0403   BUN 13 07/17/2018 0345   BUN 17 03/24/2013 0403   CREATININE 0.95 07/17/2018 0345   CREATININE 1.23 03/24/2013 0403   CALCIUM 8.1 (L) 07/17/2018 0345   CALCIUM 7.8 (L) 03/24/2013 0403   GFRNONAA >60 07/17/2018 0345   GFRNONAA 60 (L) 03/24/2013  0403   GFRAA >60 07/17/2018 0345   GFRAA >60 03/24/2013 0403   Lipase  No results found for: LIPASE     Studies/Results: No results found.  Anti-infectives: Anti-infectives (From admission, onward)   Start     Dose/Rate Route Frequency Ordered Stop   07/13/18 1800  piperacillin-tazobactam (ZOSYN) IVPB 3.375 g     3.375 g 12.5 mL/hr over 240 Minutes Intravenous Every 8 hours 07/13/18 1532     07/13/18 1200  piperacillin-tazobactam (ZOSYN) IVPB 3.375 g     3.375 g 100 mL/hr over 30 Minutes Intravenous  Once 07/13/18 1151 07/13/18 1704       Assessment/Plan  Strangulated Recurrent Right Inguinal Hernia  - POD4, continues to show clinical improvement this morning, no complaints of abdominal pain, nausea, or emesis. Is reporting flatus.  - Will advance to full liquid diet this morning, if he tolerates that this morning will advance diet and possible discharge home this afternoon.   - Leukocytosis resolved - Hypokalemia on BMP, will repleat.  - D/C IVF - Continue IV Abx - Continue CIWA - Mobilize as tolerates, may consider PT reassessment - DVT Prophylaxis - Possibly home this afternoon    LOS: 4 days    Edison Simon , PA-C [facility] 07/17/2018, 9:02 AM [phone] M-F: 7am - 4pm

## 2018-07-17 NOTE — Progress Notes (Signed)
JP drain removed per order.    [employer]

## 2018-07-17 NOTE — Discharge Summary (Addendum)
Discharge Summary  Patient ID: Victor Ramos MRN: [id number] DOB/AGE: [birth year] 77 y.o.  Admit date: 07/13/2018 Discharge date: 07/17/2018  Discharge Diagnoses Incarcerated right inguinal hernia  Consultants None  Procedures Right Inguinal hernia Repair with Small Bowel Resection on 07/13/18  HPI: Victor Ramos is a 77 y.o. male who presents to the hospital with a recurrent right inguinal hernia.  The patient reports 4 prior right inguinal hernia repair through the years.  He says that about 3 days ago he developed a bulge in the right groin and has worsened over the weekend.  It has become more prominent and more tender and tense.  He denies any abdominal pain, fever, chills, chest pain, or shortness of breath.  Denies any nausea or vomiting but is constipated and has not had a bowel movement in three days either.    He presented to the ED today and had labs and CT scan which showed a right inguinal hernia containing small bowel with the tip of the bladder dome extending into it too. There is concern for strangulation based on the amount of inflammation and fluid.  His creatinine is elevated and he's hemoconcentrated.  He has atrial fibrillation and takes Pradaxa for it.  Hospital Course: He underwent Right Inguinal hernia Repair with Small Bowel Resection on 07/13/18. NG Tube remained in placed overnight to provide bowel rest. On POD2 he began to become very agitated over night and hallucinated. It was established at time that, per the patient's wife, he drank "2-3 rum and cokes a night." CIWA protocol and IVC was initiated. His agitation gradually improved over the course of the admission. His diet was slowly advanced and his WBC was trended back to the normal range. On the day of discharge (07/13/18), Victor Ramos was tolerating a soft diet, mobilizing well, and his pain was adequately controlled. His JP drain was removed prior to discharge. He is too follow up in 1 week for staple  removal. He will go home on 4 days of Augmentin to complete 1 week course of ABx given necrotic bowel during surgery. All of his and his wife's questions were addressed and answered.   Patient no longer requires IVC   Allergies as of 07/17/2018   No Known Allergies     Medication List    TAKE these medications   amLODipine 10 MG tablet Commonly known as:  NORVASC Take 10 mg by mouth daily.   amoxicillin-clavulanate 875-125 MG tablet Commonly known as:  AUGMENTIN Take 1 tablet by mouth 2 (two) times daily for 4 days.   aspirin 81 MG tablet Take 81 mg by mouth daily.   budesonide-formoterol 160-4.5 MCG/ACT inhaler Commonly known as:  SYMBICORT Inhale 2 puffs into the lungs 2 (two) times daily.   dabigatran 150 MG Caps capsule Commonly known as:  PRADAXA Take 150 mg by mouth 2 (two) times daily.   finasteride 5 MG tablet Commonly known as:  PROSCAR Take 5 mg by mouth daily.   lisinopril 40 MG tablet Commonly known as:  PRINIVIL,ZESTRIL Take 40 mg by mouth daily.   magnesium 30 MG tablet    Melatonin 5 MG Tabs Take 1 tablet by mouth at bedtime.   metoprolol succinate 25 MG 24 hr tablet Commonly known as:  TOPROL-XL Take 25 mg by mouth 2 (two) times daily.   oxyCODONE 5 MG immediate release tablet Commonly known as:  Oxy IR/ROXICODONE Take 1 tablet (5 mg total) by mouth every 6 (six) hours as needed for  severe pain.   potassium chloride 10 MEQ tablet Commonly known as:  K-DUR,KLOR-CON Take 10 mEq by mouth daily.   pravastatin 40 MG tablet Commonly known as:  PRAVACHOL Take 20 mg by mouth daily.   tamsulosin 0.4 MG Caps capsule Commonly known as:  FLOMAX Take 0.4 mg by mouth daily.   tiotropium 18 MCG inhalation capsule Commonly known as:  SPIRIVA Place 18 mcg into inhaler and inhale daily.        Follow-up Information    Olean Ree, MD. Schedule an appointment as soon as possible for a visit on 07/22/2018.   Specialty:  General Surgery Why:   Make an appointment for 09/25 with Dr. Olean Ree. s/p right inguinal hernia repair with bowel resection, staples to be removed at this appointment  Contact information: [address] [place] [address] [phone]           Signed: Edison Simon , PA-C [facility]  07/17/2018, 1:30 PM [phone] M-F: 7am - 4pm

## 2018-07-20 ENCOUNTER — Telehealth: Payer: Self-pay | Admitting: *Deleted

## 2018-07-20 ENCOUNTER — Ambulatory Visit (INDEPENDENT_AMBULATORY_CARE_PROVIDER_SITE_OTHER): Payer: Medicare Other | Admitting: Surgery

## 2018-07-20 ENCOUNTER — Telehealth: Payer: Self-pay

## 2018-07-20 ENCOUNTER — Other Ambulatory Visit
Admission: RE | Admit: 2018-07-20 | Discharge: 2018-07-20 | Disposition: A | Discharge status: Home / Self Care | Payer: Medicare Other | Source: Ambulatory Visit | Attending: Surgery | Admitting: Surgery

## 2018-07-20 ENCOUNTER — Other Ambulatory Visit: Payer: Self-pay | Admitting: Surgery

## 2018-07-20 ENCOUNTER — Encounter: Payer: Self-pay | Admitting: Surgery

## 2018-07-20 VITALS — BP 97/67 | HR 112 | Temp 96.1°F | Ht 68.0 in | Wt 201.0 lb

## 2018-07-20 DIAGNOSIS — K4031 Unilateral inguinal hernia, with obstruction, without gangrene, recurrent: Secondary | ICD-10-CM | POA: Insufficient documentation

## 2018-07-20 DIAGNOSIS — Z9229 Personal history of other drug therapy: Secondary | ICD-10-CM | POA: Insufficient documentation

## 2018-07-20 LAB — COMPREHENSIVE METABOLIC PANEL
ALBUMIN: 3 g/dL — AB (ref 3.5–5.0)
ALK PHOS: 55 U/L (ref 38–126)
ALT: 33 U/L (ref 0–44)
AST: 22 U/L (ref 15–41)
Anion gap: 9 (ref 5–15)
BILIRUBIN TOTAL: 0.3 mg/dL (ref 0.3–1.2)
BUN: 26 mg/dL — AB (ref 8–23)
CALCIUM: 9 mg/dL (ref 8.9–10.3)
CO2: 25 mmol/L (ref 22–32)
CREATININE: 1.17 mg/dL (ref 0.61–1.24)
Chloride: 104 mmol/L (ref 98–111)
GFR calc Af Amer: >60 mL/min (ref >60)
GFR calc non Af Amer: 60 mL/min — ABNORMAL LOW (ref >60)
GLUCOSE: 159 mg/dL — AB (ref 70–99)
Potassium: 3.3 mmol/L — ABNORMAL LOW (ref 3.5–5.1)
Sodium: 138 mmol/L (ref 135–145)
TOTAL PROTEIN: 5.5 g/dL — AB (ref 6.5–8.1)

## 2018-07-20 LAB — CBC WITH DIFFERENTIAL/PLATELET
BASOS PCT: 0 %
Basophils Absolute: 0.1 10*3/uL (ref 0–0.1)
Eosinophils Absolute: 0.2 10*3/uL (ref 0–0.7)
Eosinophils Relative: 1 %
HEMATOCRIT: 24.9 % — AB (ref 40.0–52.0)
HEMOGLOBIN: 8.8 g/dL — AB (ref 13.0–18.0)
LYMPHS ABS: 3.9 10*3/uL — AB (ref 1.0–3.6)
Lymphocytes Relative: 25 %
MCH: 31.9 pg (ref 26.0–34.0)
MCHC: 35.2 g/dL (ref 32.0–36.0)
MCV: 90.5 fL (ref 80.0–100.0)
MONOS PCT: 7 %
Monocytes Absolute: 1.1 10*3/uL — ABNORMAL HIGH (ref 0.2–1.0)
NEUTROS ABS: 10.3 10*3/uL — AB (ref 1.4–6.5)
NEUTROS PCT: 67 %
Platelets: 352 10*3/uL (ref 150–440)
RBC: 2.75 MIL/uL — AB (ref 4.40–5.90)
RDW: 13.5 % (ref 11.5–14.5)
WBC: 15.6 10*3/uL — AB (ref 3.8–10.6)

## 2018-07-20 LAB — SURGICAL PATHOLOGY

## 2018-07-20 LAB — PROTIME-INR
INR: 1.18
Prothrombin Time: 14.9 seconds (ref 11.4–15.2)

## 2018-07-20 LAB — APTT: aPTT: 31 seconds (ref 24–36)

## 2018-07-20 NOTE — Telephone Encounter (Signed)
Patient will need to be seen by Dr. Dahlia Byes today due to his symptoms.

## 2018-07-20 NOTE — Telephone Encounter (Signed)
Dr. Reuel Derby called for a call report. She wanted me to let Dr. 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 I told Dr. Reuel Derby that I would inform Dr. Hampton Abbot about result.

## 2018-07-20 NOTE — Telephone Encounter (Signed)
Answering service called stated that patient called, had surgery on 07/13/18 by Dr.Piscoya and he is now having bloody diarrhea and is feeling weak.

## 2018-07-20 NOTE — Progress Notes (Signed)
S/p open RIH repair w sb resection by Dr. Hampton Abbot Did well but had some melena and weakness Taking PO, no fevers or chills Walking w walker  PE NAD Debilitated and weak Abd: soft, nt, incision w staples in place, no peritonitis  A/p Melena ? Fluid from incarceration vs GI bleed Hold BP meds for next 48 hrs We will hold off anticoagulation and ASA No need for emergent surgical intervention Check CBC and cmp , if continues to have melena may need to be send to the hospital for further w/u to include EGD + colonoscopy. He is to follow w Dr. Hampton Abbot in less 48 hrs unless labs mandate immediate attention

## 2018-07-20 NOTE — Patient Instructions (Addendum)
Patient to hold the blood pressure and aspirin . Return tomorrow to see Dr. Hampton Abbot .

## 2018-07-22 ENCOUNTER — Ambulatory Visit (INDEPENDENT_AMBULATORY_CARE_PROVIDER_SITE_OTHER): Payer: Medicare Other | Admitting: Surgery

## 2018-07-22 ENCOUNTER — Other Ambulatory Visit: Payer: Self-pay

## 2018-07-22 ENCOUNTER — Other Ambulatory Visit
Admission: RE | Admit: 2018-07-22 | Discharge: 2018-07-22 | Disposition: A | Discharge status: Home / Self Care | Payer: Medicare Other | Source: Ambulatory Visit | Attending: Surgery | Admitting: Surgery

## 2018-07-22 ENCOUNTER — Encounter: Payer: Self-pay | Admitting: Internal Medicine

## 2018-07-22 ENCOUNTER — Inpatient Hospital Stay
Admission: EM | Admit: 2018-07-22 | Discharge: 2018-07-24 | DRG: 378 | Disposition: A | Discharge status: Home / Self Care | Payer: Medicare Other | Source: Ambulatory Visit | Attending: Internal Medicine | Admitting: Internal Medicine

## 2018-07-22 ENCOUNTER — Encounter: Payer: Self-pay | Admitting: Surgery

## 2018-07-22 VITALS — BP 145/77 | HR 112 | Temp 97.9°F | Resp 11 | Ht 68.0 in | Wt 205.0 lb

## 2018-07-22 DIAGNOSIS — D62 Acute posthemorrhagic anemia: Secondary | ICD-10-CM | POA: Diagnosis present

## 2018-07-22 DIAGNOSIS — K921 Melena: Principal | ICD-10-CM | POA: Diagnosis present

## 2018-07-22 DIAGNOSIS — I4891 Unspecified atrial fibrillation: Secondary | ICD-10-CM | POA: Diagnosis present

## 2018-07-22 DIAGNOSIS — Z7982 Long term (current) use of aspirin: Secondary | ICD-10-CM

## 2018-07-22 DIAGNOSIS — Z7902 Long term (current) use of antithrombotics/antiplatelets: Secondary | ICD-10-CM

## 2018-07-22 DIAGNOSIS — I10 Essential (primary) hypertension: Secondary | ICD-10-CM | POA: Diagnosis present

## 2018-07-22 DIAGNOSIS — Z800 Family history of malignant neoplasm of digestive organs: Secondary | ICD-10-CM | POA: Diagnosis not present

## 2018-07-22 DIAGNOSIS — F0390 Unspecified dementia without behavioral disturbance: Secondary | ICD-10-CM | POA: Diagnosis present

## 2018-07-22 DIAGNOSIS — Z7951 Long term (current) use of inhaled steroids: Secondary | ICD-10-CM

## 2018-07-22 DIAGNOSIS — J449 Chronic obstructive pulmonary disease, unspecified: Secondary | ICD-10-CM | POA: Diagnosis present

## 2018-07-22 DIAGNOSIS — I2510 Atherosclerotic heart disease of native coronary artery without angina pectoris: Secondary | ICD-10-CM | POA: Diagnosis present

## 2018-07-22 DIAGNOSIS — C9110 Chronic lymphocytic leukemia of B-cell type not having achieved remission: Secondary | ICD-10-CM

## 2018-07-22 DIAGNOSIS — D649 Anemia, unspecified: Secondary | ICD-10-CM

## 2018-07-22 DIAGNOSIS — C9190 Lymphoid leukemia, unspecified not having achieved remission: Secondary | ICD-10-CM | POA: Diagnosis not present

## 2018-07-22 DIAGNOSIS — Z8249 Family history of ischemic heart disease and other diseases of the circulatory system: Secondary | ICD-10-CM

## 2018-07-22 DIAGNOSIS — K4031 Unilateral inguinal hernia, with obstruction, without gangrene, recurrent: Secondary | ICD-10-CM

## 2018-07-22 DIAGNOSIS — K922 Gastrointestinal hemorrhage, unspecified: Secondary | ICD-10-CM | POA: Diagnosis present

## 2018-07-22 DIAGNOSIS — Z87891 Personal history of nicotine dependence: Secondary | ICD-10-CM

## 2018-07-22 DIAGNOSIS — Z833 Family history of diabetes mellitus: Secondary | ICD-10-CM | POA: Diagnosis not present

## 2018-07-22 DIAGNOSIS — Z955 Presence of coronary angioplasty implant and graft: Secondary | ICD-10-CM

## 2018-07-22 DIAGNOSIS — C8510 Unspecified B-cell lymphoma, unspecified site: Secondary | ICD-10-CM | POA: Diagnosis present

## 2018-07-22 HISTORY — DX: Unspecified dementia, unspecified severity, without behavioral disturbance, psychotic disturbance, mood disturbance, and anxiety: F03.90

## 2018-07-22 HISTORY — DX: Atherosclerotic heart disease of native coronary artery without angina pectoris: I25.10

## 2018-07-22 HISTORY — DX: Non-Hodgkin lymphoma, unspecified, unspecified site: C85.90

## 2018-07-22 HISTORY — DX: Chronic obstructive pulmonary disease, unspecified: J44.9

## 2018-07-22 LAB — COMPREHENSIVE METABOLIC PANEL
ALBUMIN: 3.2 g/dL — AB (ref 3.5–5.0)
ALK PHOS: 56 U/L (ref 38–126)
ALT: 34 U/L (ref 0–44)
ANION GAP: 9 (ref 5–15)
AST: 27 U/L (ref 15–41)
BUN: 18 mg/dL (ref 8–23)
CO2: 27 mmol/L (ref 22–32)
CREATININE: 1.18 mg/dL (ref 0.61–1.24)
Calcium: 8.9 mg/dL (ref 8.9–10.3)
Chloride: 104 mmol/L (ref 98–111)
GFR calc Af Amer: >60 mL/min (ref >60)
GFR calc non Af Amer: 59 mL/min — ABNORMAL LOW (ref >60)
GLUCOSE: 121 mg/dL — AB (ref 70–99)
Potassium: 3.3 mmol/L — ABNORMAL LOW (ref 3.5–5.1)
SODIUM: 140 mmol/L (ref 135–145)
Total Bilirubin: 0.3 mg/dL (ref 0.3–1.2)
Total Protein: 5.8 g/dL — ABNORMAL LOW (ref 6.5–8.1)

## 2018-07-22 LAB — CBC
HCT: 23.2 % — ABNORMAL LOW (ref 40.0–52.0)
HCT: 23.1 % — ABNORMAL LOW (ref 40.0–52.0)
HEMOGLOBIN: 7.8 g/dL — AB (ref 13.0–18.0)
HEMOGLOBIN: 7.9 g/dL — AB (ref 13.0–18.0)
MCH: 31 pg (ref 26.0–34.0)
MCH: 31.3 pg (ref 26.0–34.0)
MCHC: 33.8 g/dL (ref 32.0–36.0)
MCHC: 34.2 g/dL (ref 32.0–36.0)
MCV: 91.6 fL (ref 80.0–100.0)
MCV: 91.5 fL (ref 80.0–100.0)
Platelets: 337 10*3/uL (ref 150–440)
Platelets: 337 10*3/uL (ref 150–440)
RBC: 2.53 MIL/uL — AB (ref 4.40–5.90)
RBC: 2.52 MIL/uL — ABNORMAL LOW (ref 4.40–5.90)
RDW: 13.5 % (ref 11.5–14.5)
RDW: 13.5 % (ref 11.5–14.5)
WBC: 11.6 10*3/uL — AB (ref 3.8–10.6)
WBC: 11.4 10*3/uL — ABNORMAL HIGH (ref 3.8–10.6)

## 2018-07-22 LAB — PREPARE RBC (CROSSMATCH)

## 2018-07-22 LAB — ABO/RH: ABO/RH(D): A POS

## 2018-07-22 MED ORDER — METOPROLOL SUCCINATE ER 25 MG PO TB24
25.0000 mg | ORAL_TABLET | Freq: Two times a day (BID) | ORAL | Status: DC
  Administered 2018-07-22 – 2018-07-24 (×4): 25 mg via ORAL
  Filled 2018-07-22 (×4): qty 1

## 2018-07-22 MED ORDER — PANTOPRAZOLE SODIUM 40 MG IV SOLR: 40.0000 mg | Freq: Two times a day (BID) | INTRAVENOUS | Status: DC

## 2018-07-22 MED ORDER — SODIUM CHLORIDE 0.9 % IV SOLN
8.0000 mg/h | INTRAVENOUS | Status: DC
  Administered 2018-07-22 – 2018-07-24 (×4): 8 mg/h via INTRAVENOUS
  Filled 2018-07-22 (×5): qty 80

## 2018-07-22 MED ORDER — FLUTICASONE FUROATE-VILANTEROL 200-25 MCG/INH IN AEPB
1.0000 | INHALATION_SPRAY | Freq: Every day | RESPIRATORY_TRACT | Status: DC
  Administered 2018-07-23: 1 via RESPIRATORY_TRACT
  Filled 2018-07-22: qty 28

## 2018-07-22 MED ORDER — ACETAMINOPHEN 325 MG PO TABS: 650.0000 mg | ORAL_TABLET | Freq: Four times a day (QID) | ORAL | Status: DC | PRN

## 2018-07-22 MED ORDER — MELATONIN 5 MG PO TABS
1.0000 | ORAL_TABLET | Freq: Every day | ORAL | Status: DC
  Administered 2018-07-22 – 2018-07-23 (×2): 5 mg via ORAL
  Filled 2018-07-22 (×3): qty 1

## 2018-07-22 MED ORDER — ONDANSETRON HCL 4 MG PO TABS: 4.0000 mg | ORAL_TABLET | Freq: Four times a day (QID) | ORAL | Status: DC | PRN

## 2018-07-22 MED ORDER — PRAVASTATIN SODIUM 20 MG PO TABS
20.0000 mg | ORAL_TABLET | Freq: Every day | ORAL | Status: DC
  Administered 2018-07-23: 20 mg via ORAL
  Filled 2018-07-22: qty 1

## 2018-07-22 MED ORDER — ACETAMINOPHEN 650 MG RE SUPP: 650.0000 mg | Freq: Four times a day (QID) | RECTAL | Status: DC | PRN

## 2018-07-22 MED ORDER — SODIUM CHLORIDE 0.9 % IV SOLN
10.0000 mL/h | Freq: Once | INTRAVENOUS | Status: AC
  Administered 2018-07-22: 10 mL/h via INTRAVENOUS

## 2018-07-22 MED ORDER — TAMSULOSIN HCL 0.4 MG PO CAPS
0.4000 mg | ORAL_CAPSULE | Freq: Every day | ORAL | Status: DC
  Administered 2018-07-22 – 2018-07-23 (×2): 0.4 mg via ORAL
  Filled 2018-07-22 (×2): qty 1

## 2018-07-22 MED ORDER — POTASSIUM CHLORIDE CRYS ER 20 MEQ PO TBCR
10.0000 meq | EXTENDED_RELEASE_TABLET | Freq: Every day | ORAL | Status: DC
  Administered 2018-07-23: 10 meq via ORAL
  Filled 2018-07-22: qty 1

## 2018-07-22 MED ORDER — TIOTROPIUM BROMIDE MONOHYDRATE 18 MCG IN CAPS
18.0000 ug | ORAL_CAPSULE | Freq: Every day | RESPIRATORY_TRACT | Status: DC
  Administered 2018-07-23 – 2018-07-24 (×2): 18 ug via RESPIRATORY_TRACT
  Filled 2018-07-22: qty 5

## 2018-07-22 MED ORDER — ONDANSETRON HCL 4 MG/2ML IJ SOLN: 4.0000 mg | Freq: Four times a day (QID) | INTRAMUSCULAR | Status: DC | PRN

## 2018-07-22 MED ORDER — CALCIUM CARBONATE ANTACID 500 MG PO CHEW
1.0000 | CHEWABLE_TABLET | Freq: Two times a day (BID) | ORAL | Status: DC
  Administered 2018-07-22 – 2018-07-23 (×3): 200 mg via ORAL
  Filled 2018-07-22 (×2): qty 1

## 2018-07-22 MED ORDER — SODIUM CHLORIDE 0.9 % IV SOLN
80.0000 mg | Freq: Once | INTRAVENOUS | Status: AC
  Administered 2018-07-22: 80 mg via INTRAVENOUS
  Filled 2018-07-22: qty 80

## 2018-07-22 MED ORDER — MAGNESIUM OXIDE 400 (241.3 MG) MG PO TABS
400.0000 mg | ORAL_TABLET | Freq: Every day | ORAL | Status: DC
  Administered 2018-07-23: 400 mg via ORAL
  Filled 2018-07-22: qty 1

## 2018-07-22 MED ORDER — FINASTERIDE 5 MG PO TABS
5.0000 mg | ORAL_TABLET | Freq: Every day | ORAL | Status: DC
  Administered 2018-07-23: 5 mg via ORAL
  Filled 2018-07-22: qty 1

## 2018-07-22 NOTE — Progress Notes (Signed)
07/22/2018  HPI: Victor Ramos is a 77 y.o. male s/p strangulated right inguinal hernia repair with small bowel resection and primary anastomosis on 9/16.  He presented on 9/23 with low blood pressure and blood in the stool and CBC showed a Hgb of 8.8.  His Pradaxa and Aspirin were stopped and his blood pressure medications were held.  Today he presents for follow up.  He reports that he is "wobbly" at times but denies any dizziness.  His blood pressure at home has been in the 262M systolic. Though here in the office it is elevated to 145/77.  He also reports some swelling at the right groin extending to the scrotum.  Denies any bleeding from the incision but still has blood in his stool.    Vital signs: BP (!) 145/77   Pulse (!) 112   Temp 97.9 F (36.6 C) (Temporal)   Resp 11   Ht 5\' 8"  (1.727 m)   Wt 205 lb (93 kg)   SpO2 96%   BMI 31.17 kg/m    Physical Exam: Constitutional: No acute distress Abdomen:  Soft, nondistended, nontender to palpation.  Incision is clean, dry, intact with staples in place but there is swelling around the incision extending to the right scrotum, likely post-op.  Assessment/Plan: This is a 77 y.o. male s/p strangulated right inguinal hernia repair with small bowel resection.  --Discussed pathology results with the patient.  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  The small bowel was negative for malignancy.  Discussed with the patient that at this point, this is all we know and he would need further work up for this.  He will be discussed at our multidisciplinary tumor board tomorrow.  One of my partners, Dr. Tama High, will be at the meeting.  Discussed with the patient that likely he would need imaging study for further diagnosis and staging and likely chemotherapy.  If so, we'd be happy to help with port-a-cath placement.  We will place a referral for our Oncology colleagues. --Given his persistent  blood per rectum, we'll get repeat CBC.  If his Hgb continues to decrease, then likely he would have to go to ER for admission for GI bleed workup.  I do not see a colonoscopy or EGD in his chart and likely he would require a GI consult for this.  We will call him with the results of his CBC. --The wound is healing well and staples were removed in office and changed for steri strips.  Given the swelling at the incision and scrotum, he will follow up with [place] for wound check in a month.   Melvyn Neth, [facility]

## 2018-07-22 NOTE — ED Provider Notes (Addendum)
[facility] Emergency Department Provider Note  ____________________________________________  Time seen: Approximately 7:54 PM  I have reviewed the triage vital signs and the nursing notes.   HISTORY  Chief Complaint GI Bleeding and Abnormal Lab  Level 5 Caveat: Portions of the History and Physical including HPI and review of systems are unable to be completely obtained due to patient being a poor historian due to dementia   HPI Victor Ramos is a 77 y.o. male with a history of A. fib and hypertension who had emergency surgery for a strangulated inguinal hernia resulting in open repair and bowel resection 2 weeks ago who reports having bloody bowel movements since surgery.  He is feeling weak and dizzy.  He was called to come to the ED today because his blood counts have been dropping.   Wife also notes that he was recently diagnosed with lymphoma in the scrotum.  He is having maroon-colored bloody bowel movements every day.  2 episodes today.  He normally takes Pradaxa but she last gave this to him to 3 days ago.  No nausea or vomiting.   Past Medical History:  Diagnosis Date  . Atrial fibrillation ([place])   . BPH (benign prostatic hyperplasia)   . Hypertension      Patient Active Problem List   Diagnosis Date Noted  . 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 BOWEL RESECTION  07/13/2018   Procedure: SMALL BOWEL RESECTION;  Surgeon: Olean Ree, MD;  Location: [facility] ORS;  Service: General;;  . CORONARY ANGIOPLASTY WITH STENT PLACEMENT    . HERNIA REPAIR    . INGUINAL HERNIA REPAIR Right 07/13/2018   Procedure: HERNIA REPAIR INGUINAL ADULT;  Surgeon: Olean Ree, MD;  Location: [facility] ORS;  Service: General;  Laterality: Right;  . JOINT REPLACEMENT       Prior to Admission medications   Medication Sig Start Date End Date Taking? Authorizing Provider  amLODipine  (NORVASC) 10 MG tablet Take 10 mg by mouth daily.    [provider]  aspirin 81 MG tablet Take 81 mg by mouth daily.    [provider]  budesonide-formoterol (SYMBICORT) 160-4.5 MCG/ACT inhaler Inhale 2 puffs into the lungs 2 (two) times daily.    [provider]  dabigatran (PRADAXA) 150 MG CAPS capsule Take 150 mg by mouth 2 (two) times daily.    [provider]  finasteride (PROSCAR) 5 MG tablet Take 5 mg by mouth daily.    [provider]  lisinopril (PRINIVIL,ZESTRIL) 40 MG tablet Take 40 mg by mouth daily.    [provider]  magnesium 30 MG tablet Take 250 mg by mouth daily.     [provider]  Melatonin 5 MG TABS Take 1 tablet by mouth at bedtime.     [provider]  metoprolol succinate (TOPROL-XL) 25 MG 24 hr tablet Take 25 mg by mouth 2 (two) times daily.    [provider]  oxyCODONE (OXY IR/ROXICODONE) 5 MG immediate release tablet Take 1 tablet (5 mg total) by mouth every 6 (six) hours as needed for severe pain. Patient not taking: Reported on 07/22/2018 07/17/18   Tylene Fantasia, PA-C  potassium chloride (K-DUR,KLOR-CON) 10 MEQ tablet Take 10 mEq by mouth daily.     [provider]  pravastatin (PRAVACHOL) 40 MG tablet Take 20 mg by mouth daily.    [provider]  tamsulosin (  FLOMAX) 0.4 MG CAPS capsule Take 0.4 mg by mouth daily.     [provider]  tiotropium (SPIRIVA) 18 MCG inhalation capsule Place 18 mcg into inhaler and inhale daily.    [provider]     Allergies Patient has no known allergies.   Family History  Problem Relation Age of Onset  . Cancer Mother   . Heart attack Father   . Cancer Brother     Social History Social History   Tobacco Use  . Smoking status: Former Research scientist (life sciences)  . Smokeless tobacco: Never Used  Substance Use Topics  . Alcohol use: Yes    Comment: rarely  . Drug use: No    Review of Systems  Constitutional:    No fever or chills.  Cardiovascular:   No chest pain or syncope. Respiratory:   No dyspnea or cough. Gastrointestinal:   Negative for abdominal pain, vomiting, positive bloody bowel movements. Musculoskeletal:   Negative for focal pain or swelling All other systems reviewed and are negative except as documented above in ROS and HPI.  ____________________________________________   PHYSICAL EXAM:  VITAL SIGNS: ED Triage Vitals  Enc Vitals Group     BP 07/22/18 1640 134/64     Pulse Rate 07/22/18 1640 (!) 104     Resp 07/22/18 1640 16     Temp 07/22/18 1640 98.7 F (37.1 C)     Temp Source 07/22/18 1640 Oral     SpO2 07/22/18 1640 100 %     Weight 07/22/18 1642 202 lb 13.2 oz (92 kg)     Height 07/22/18 1642 5\' 8"  (1.727 m)     Head Circumference --      Peak Flow --      Pain Score 07/22/18 1642 0     Pain Loc --      Pain Edu? --      Excl. in [place]? --     Vital signs reviewed, nursing assessments reviewed.   Constitutional:   Alert and oriented.  Ill-appearing. Eyes:   Conjunctivae are pale. EOMI. PERRL. ENT      Head:   Normocephalic and atraumatic.      Nose:   No congestion/rhinnorhea.       Mouth/Throat:   Dry mucous membranes, no pharyngeal erythema. No peritonsillar mass.       Neck:   No meningismus. Full ROM. Hematological/Lymphatic/Immunilogical:   No cervical lymphadenopathy. Cardiovascular:   Tachycardia heart rate 110. Symmetric bilateral radial and DP pulses.  No murmurs. Cap refill less than 2 seconds. Respiratory:   Normal respiratory effort without tachypnea/retractions. Breath sounds are clear and equal bilaterally. No wheezes/rales/rhonchi. Gastrointestinal:   Soft and nontender. Non distended. There is no CVA tenderness.  No rebound, rigidity, or guarding.  Subcutaneous mass in the right abdominal wall.  Rectal exam negative for hemorrhoids, positive for maroon stool.  Right inguinal incision well-healed, noninflamed Genitourinary:   Scrotal mass.   Noninflamed Musculoskeletal:   Normal range of motion in all extremities. No joint effusions.  No lower extremity tenderness.  No edema. Neurologic:   Normal speech and language.  Motor grossly intact. No acute focal neurologic deficits are appreciated.  Skin:    Skin is warm, dry and intact. No rash noted.  No petechiae, purpura, or bullae.  ____________________________________________    LABS (pertinent positives/negatives) (all labs ordered are listed, but only abnormal results are displayed) Labs Reviewed  COMPREHENSIVE METABOLIC PANEL - Abnormal; Notable for the following components:  Result Value   Potassium 3.3 (*)    Glucose, Bld 121 (*)    Total Protein 5.8 (*)    Albumin 3.2 (*)    GFR calc non Af Amer 59 (*)    All other components within normal limits  CBC - Abnormal; Notable for the following components:   WBC 11.4 (*)    RBC 2.52 (*)    Hemoglobin 7.9 (*)    HCT 23.1 (*)    All other components within normal limits  POC OCCULT BLOOD, ED  TYPE AND SCREEN  PREPARE RBC (CROSSMATCH)  ABO/RH   ____________________________________________   EKG    ____________________________________________    RADIOLOGY  No results found.  ____________________________________________   PROCEDURES .Critical Care Performed by: Carrie Mew, MD Authorized by: Carrie Mew, MD   Critical care provider statement:    Critical care time (minutes):  30   Critical care time was exclusive of:  Separately billable procedures and treating other patients   Critical care was necessary to treat or prevent imminent or life-threatening deterioration of the following conditions:  Shock and circulatory failure   Critical care was time spent personally by me on the following activities:  Development of treatment plan with patient or surrogate, discussions with consultants, evaluation of patient's response to treatment, examination of patient, obtaining history from  patient or surrogate, ordering and performing treatments and interventions, ordering and review of laboratory studies, ordering and review of radiographic studies, pulse oximetry, re-evaluation of patient's condition and review of old charts    ____________________________________________    CLINICAL IMPRESSION / [place] / ED COURSE  Pertinent labs & imaging results that were available during my care of the patient were reviewed by me and considered in my medical decision making (see chart for details).    Patient presents with GI bleed after surgery.  Hemoglobin is dropped from a baseline of 12 down to 7.8 today.  He symptomatic.  Last Pradaxa was 3 days ago, according to pharmacy this does not require reversal at this time since he has no effective anticoagulation at this moment.  Blood pressure is stable but with tachycardia and symptoms, indicative of early shock from acute blood loss so I will transfuse him 1 unit to which he and the wife verbally consent.  Case discussed with hospitalist for admission      ____________________________________________   FINAL CLINICAL IMPRESSION(S) / ED DIAGNOSES    Final diagnoses:  Lower GI bleed  Symptomatic anemia     ED Discharge Orders    None      Portions of this note were generated with dragon dictation software. Dictation errors may occur despite best attempts at proofreading.    Carrie Mew, MD 07/22/18 Imelda Pillow, MD 07/22/18 2001

## 2018-07-22 NOTE — Patient Instructions (Addendum)
Patient needs to have lab work completed today (cbc). Continue medication for blood pressure if it is 150/90 use one medication, if it drops below 100/80 stop medication. Staples have been removed and steri-strips have been place over incision. Refer patient to the Oncologist and [place].

## 2018-07-22 NOTE — ED Notes (Signed)
Discussed long wait times with patient.  Acknowledged that patient is very critical and this RN is working to get patient back to a room.  Patient appears frustrated to this RN.  Patient's wife states, "he has a little dementia".

## 2018-07-22 NOTE — H&P (Signed)
[place] [facility] [place] NAME: Victor Ramos    MR#:  [id number]  DATE OF BIRTH:  [birth year]  DATE OF ADMISSION:  07/22/2018  PRIMARY CARE PHYSICIAN: [facility] [place]   REQUESTING/REFERRING PHYSICIAN: Dr Carrie Mew  CHIEF COMPLAINT:   Chief Complaint  Patient presents with  . GI Bleeding  . Abnormal Lab    HISTORY OF PRESENT ILLNESS:  Victor Ramos  is a 77 y.o. male sent in for anemia and GI bleed.  The patient was recently in the hospital and had a strangulated hernia repair done by Dr. Hampton Abbot.  The patient was restarted on aspirin and Pradaxa and sent home.  The patient's been having burgundy bowel movements.  States 2 on Saturday, 5-6 on Sunday, 2 on Monday, 1 yesterday and 2 today.  The patient has been off his aspirin, Pradaxa Norvasc and lisinopril for 3 days now.  The patient went for follow-up appointment to take his staples out and they got blood work and found him anemic and sent him to the ER.  The patient's hemoglobin on 07/17/2018 was 12.3 and today is down to 7.9.  The ER physician ordered a unit of blood and hospitalist services were contacted for further evaluation.  PAST MEDICAL HISTORY:   Past Medical History:  Diagnosis Date  . Atrial fibrillation ([place])   . BPH (benign prostatic hyperplasia)   . COPD (chronic obstructive pulmonary disease) ([place])   . Coronary artery disease   . Dementia   . Hypertension   . Lymphoma ([place])     PAST SURGICAL HISTORY:   Past Surgical History:  Procedure Laterality Date  . BOWEL RESECTION  07/13/2018   Procedure: SMALL BOWEL RESECTION;  Surgeon: Olean Ree, MD;  Location: [facility] ORS;  Service: General;;  . CORONARY ANGIOPLASTY WITH STENT PLACEMENT    . HERNIA REPAIR    . INGUINAL HERNIA REPAIR Right 07/13/2018   Procedure: HERNIA REPAIR INGUINAL ADULT;  Surgeon: Olean Ree, MD;  Location: [facility] ORS;  Service: General;  Laterality: Right;  . JOINT REPLACEMENT       SOCIAL HISTORY:   Social History   Tobacco Use  . Smoking status: Former Research scientist (life sciences)  . Smokeless tobacco: Never Used  Substance Use Topics  . Alcohol use: Yes    Comment: rarely    FAMILY HISTORY:   Family History  Problem Relation Age of Onset  . Stomach cancer Mother   . Heart attack Father   . Diabetes Father   . Cancer Brother     DRUG ALLERGIES:  No Known Allergies  REVIEW OF SYSTEMS:  CONSTITUTIONAL: No fever, fatigue or weakness.  EYES: No blurred or double vision.  Wears glasses. EARS, NOSE, AND THROAT: No tinnitus or ear pain. No sore throat RESPIRATORY: No cough, shortness of breath, wheezing or hemoptysis.  CARDIOVASCULAR: No chest pain, orthopnea, edema.  GASTROINTESTINAL: No nausea, vomiting, diarrhea or abdominal pain.  [place] bowel movements GENITOURINARY: No dysuria, hematuria.  ENDOCRINE: No polyuria, nocturia,  HEMATOLOGY: No anemia, easy bruising or bleeding SKIN: No rash or lesion. MUSCULOSKELETAL: No joint pain or arthritis.   NEUROLOGIC: No tingling, numbness, weakness.  PSYCHIATRY: History of anxiety.  MEDICATIONS AT HOME:   Prior to Admission medications   Medication Sig Start Date End Date Taking? Authorizing Provider  amLODipine (NORVASC) 10 MG tablet Take 10 mg by mouth daily.    [provider]  aspirin 81 MG tablet Take 81 mg by mouth daily.  [provider]  budesonide-formoterol (SYMBICORT) 160-4.5 MCG/ACT inhaler Inhale 2 puffs into the lungs 2 (two) times daily.    [provider]  dabigatran (PRADAXA) 150 MG CAPS capsule Take 150 mg by mouth 2 (two) times daily.    [provider]  finasteride (PROSCAR) 5 MG tablet Take 5 mg by mouth daily.    [provider]  lisinopril (PRINIVIL,ZESTRIL) 40 MG tablet Take 40 mg by mouth daily.    [provider]  magnesium 30 MG tablet Take 250 mg by mouth daily.     [provider]  Melatonin 5 MG TABS Take 1 tablet by mouth  at bedtime.     [provider]  metoprolol succinate (TOPROL-XL) 25 MG 24 hr tablet Take 25 mg by mouth 2 (two) times daily.    [provider]  potassium chloride (K-DUR,KLOR-CON) 10 MEQ tablet Take 10 mEq by mouth daily.     [provider]  pravastatin (PRAVACHOL) 40 MG tablet Take 20 mg by mouth daily.    [provider]  tamsulosin (FLOMAX) 0.4 MG CAPS capsule Take 0.4 mg by mouth daily.     [provider]  tiotropium (SPIRIVA) 18 MCG inhalation capsule Place 18 mcg into inhaler and inhale daily.    [provider]   Patient has been holding aspirin, Pradaxa, Norvasc and lisinopril for the last 3 days  VITAL SIGNS:  Blood pressure (!) 166/79, pulse 94, temperature 97.9 F (36.6 C), temperature source Oral, resp. rate 20, height 5\' 8"  (1.727 m), weight 92 kg, SpO2 100 %.  PHYSICAL EXAMINATION:  GENERAL:  77 y.o.-year-old patient lying in the bed with no acute distress.  EYES: Pupils equal, round, reactive to light and accommodation. No scleral icterus. Extraocular muscles intact.  HEENT: Head atraumatic, normocephalic. Oropharynx and nasopharynx clear.  NECK:  Supple, no jugular venous distention. No thyroid enlargement, no tenderness.  LUNGS: Normal breath sounds bilaterally, no wheezing, rales,rhonchi or crepitation. No use of accessory muscles of respiration.  CARDIOVASCULAR: S1, S2 normal. No murmurs, rubs, or gallops.  ABDOMEN: Soft, nontender, nondistended. Bowel sounds present. No organomegaly or mass.  EXTREMITIES: No pedal edema, cyanosis, or clubbing.  NEUROLOGIC: Cranial nerves II through XII are intact. Muscle strength 5/5 in all extremities. Sensation intact. Gait not checked.  PSYCHIATRIC: The patient is alert and oriented x 3.  SKIN: No rash, lesion, or ulcer.   LABORATORY PANEL:   CBC Recent Labs  Lab 07/22/18 1651  WBC 11.4*  HGB 7.9*  HCT 23.1*  PLT 337    ------------------------------------------------------------------------------------------------------------------  Chemistries  Recent Labs  Lab 07/22/18 1651  NA 140  K 3.3*  CL 104  CO2 27  GLUCOSE 121*  BUN 18  CREATININE 1.18  CALCIUM 8.9  AST 27  ALT 34  ALKPHOS 56  BILITOT 0.3   ------------------------------------------------------------------------------------------------------------------     EKG:   Previous admission EKG showed atrial fibrillation with rapid ventricular response  IMPRESSION AND PLAN:   1.  Acute blood loss anemia.  ER physician ordered a unit of blood.  Serial hemoglobins.  Continue to monitor closely.  Add on a ferritin.  GI consultation. 2.  GI bleed with burgundy stools.  Protonix drip.  GI consultation.  Monitor hemoglobin closely.  Continue to hold anticoagulation. 3.  Recent surgery for strangulated hernia.  Pathology showing hernia sac lymphoma.  We will get surgery and oncology consultations. 4.  Atrial fibrillation and hypertension.  Continue metoprolol twice daily.  Hold anticoagulation.  Stroke risk higher off anticoagulation but this is contraindicated now with GI bleed. 5.  COPD.  Respiratory status stable continue inhalers 6.  History of dementia. 7.  History of coronary artery disease   All laboratory data, pathology, radiological data reviewed Management plans discussed with the patient, family and they are in agreement.  CODE STATUS: full code  TOTAL TIME TAKING CARE OF THIS PATIENT: 50 minutes, including acp time.    Loletha Grayer M.D on 07/22/2018 at 9:59 PM  Between 7am to 6pm - Pager - [phone]  After 6pm call admission pager [phone]  [facility]  [phone]  CC: Primary care physician; [facility]

## 2018-07-22 NOTE — Progress Notes (Signed)
Per Dr. Hampton Abbot the patient's wife has been informed to take the patient to the ER to be admitted after receiving his CBC lab results.

## 2018-07-22 NOTE — ED Triage Notes (Signed)
Patient's wife states patient had bowel resection on 9/16. States that he is still having loose bloody bowel movements since surgery. Blood work done today shows hemoglobin of 7.8. Patient complaining of generalized weakness. Per wife, biopsy of scrotal sac came back positive for lymphoma but he has not been seen by oncology at this time.

## 2018-07-22 NOTE — Progress Notes (Signed)
Patient ID: Victor Ramos, male   DOB: [birth year], 77 y.o.   MRN: [id number]  ACP time  Patient and wife at the bedside  Diagnosis: Acute blood loss anemia, GI bleed, recent surgical intervention for incarcerated hernia, pathology showing hernia sac lymphoma, atrial fibrillation, hypertension, COPD, dementia, CAD.  CODE STATUS discussed and patient is a full code.  Plan.  Transfuse a unit of blood.  Serial hemoglobins.  GI consultation.  Protonix drip.  With the pathology showing hernia sac lymphoma I will get oncology and surgical consultations.  Time spent on ACP discussion 17 minutes Dr. Loletha Grayer

## 2018-07-22 NOTE — ED Notes (Signed)
Reviewed pt's chart, hx and results; charge nurse notified and lab work not done; pt taken immed to room 12 and report called to [place], Therapist, sports

## 2018-07-23 DIAGNOSIS — D649 Anemia, unspecified: Secondary | ICD-10-CM

## 2018-07-23 DIAGNOSIS — K922 Gastrointestinal hemorrhage, unspecified: Secondary | ICD-10-CM

## 2018-07-23 DIAGNOSIS — Z87891 Personal history of nicotine dependence: Secondary | ICD-10-CM

## 2018-07-23 DIAGNOSIS — C9190 Lymphoid leukemia, unspecified not having achieved remission: Secondary | ICD-10-CM

## 2018-07-23 LAB — CBC
HCT: 25 % — ABNORMAL LOW (ref 40.0–52.0)
Hemoglobin: 8.7 g/dL — ABNORMAL LOW (ref 13.0–18.0)
MCH: 31.5 pg (ref 26.0–34.0)
MCHC: 34.8 g/dL (ref 32.0–36.0)
MCV: 90.4 fL (ref 80.0–100.0)
Platelets: 273 10*3/uL (ref 150–440)
RBC: 2.77 MIL/uL — ABNORMAL LOW (ref 4.40–5.90)
RDW: 13.5 % (ref 11.5–14.5)
WBC: 11.6 10*3/uL — ABNORMAL HIGH (ref 3.8–10.6)

## 2018-07-23 LAB — TYPE AND SCREEN
ABO/RH(D): A POS
Antibody Screen: NEGATIVE
Unit division: 0

## 2018-07-23 LAB — BPAM RBC
Blood Product Expiration Date: 201910042359
ISSUE DATE / TIME: 201909251951
Unit Type and Rh: 6200

## 2018-07-23 LAB — FERRITIN: FERRITIN: 80 ng/mL (ref 24–336)

## 2018-07-23 LAB — HEMOGLOBIN: HEMOGLOBIN: 8.4 g/dL — AB (ref 13.0–18.0)

## 2018-07-23 LAB — BASIC METABOLIC PANEL
Anion gap: 6 (ref 5–15)
BUN: 14 mg/dL (ref 8–23)
CHLORIDE: 109 mmol/L (ref 98–111)
CO2: 28 mmol/L (ref 22–32)
Calcium: 8.5 mg/dL — ABNORMAL LOW (ref 8.9–10.3)
Creatinine, Ser: 1.12 mg/dL (ref 0.61–1.24)
GFR calc Af Amer: >60 mL/min (ref >60)
GFR calc non Af Amer: >60 mL/min (ref >60)
Glucose, Bld: 108 mg/dL — ABNORMAL HIGH (ref 70–99)
POTASSIUM: 3.3 mmol/L — AB (ref 3.5–5.1)
SODIUM: 143 mmol/L (ref 135–145)

## 2018-07-23 MED ORDER — POTASSIUM CHLORIDE CRYS ER 10 MEQ PO TBCR
10.0000 meq | EXTENDED_RELEASE_TABLET | Freq: Once | ORAL | Status: AC
  Administered 2018-07-23: 10 meq via ORAL
  Filled 2018-07-23: qty 1

## 2018-07-23 MED ORDER — POTASSIUM CHLORIDE CRYS ER 20 MEQ PO TBCR: 20.0000 meq | EXTENDED_RELEASE_TABLET | Freq: Every day | ORAL | Status: DC

## 2018-07-23 NOTE — Consult Note (Signed)
Hematology/Oncology Consult note [facility] [phone] [phone]  Patient Care Team: [facility] [place] as PCP - General (General Practice)   Name of the patient: Victor Ramos  [id number]  [birth year]   Date of visit: 07/23/18 REASON FOR COSULTATION:  Lymphoma History of presenting illness-  77 y.o. male with PMH listed at below who is currently intubated due to acute anemia secondary to GI bleeding. Patient was recently hospitalized due to strangulated hernia, status post hernia repair/bowel resection done by Dr. Hampton Abbot. Patient was on aspirin and Pradaxa for chronic atrial fibrillation.  His anticoagulation and antiplatelet medication were restarted after procedure at discharge. Patient reports having bloody bowel movements since surgery. He had lab work up done and was called to go to ER as his hemoglobin has acutely dropped significantly from baseline 12s to 7s, Patient also felt dizziness.  S/p PRBC transfusion x 1.  Hemoglobin improved appropriately to 8.7.  Hernia sac pathology came back showing hernia sac with low-grade B-cell lymphoma, most consistent with CLL/SLL. Oncology was consulted for evaluation of lymphoma. Wife at bedside.  Patient reports feeling slightly better after blood transfusion.  He was evaluated by gastroenterology and plan to have upper endoscopy tomorrow and if negative, plan colonoscopy on Saturday.  Prior to recent admissions, he denies any unintentional weight loss, fever or chills night sweats.  Denies any abdominal pain  Review of Systems  Constitutional: Positive for malaise/fatigue. Negative for chills, fever and weight loss.  HENT: Negative for nosebleeds and sore throat.   Eyes: Negative for double vision, photophobia and redness.  Respiratory: Negative for cough, shortness of breath and wheezing.   Cardiovascular: Negative for chest pain, palpitations and orthopnea.  Gastrointestinal:  Negative for abdominal pain, blood in stool, nausea and vomiting.  Genitourinary: Negative for dysuria.  Musculoskeletal: Negative for back pain, myalgias and neck pain.  Skin: Negative for itching and rash.  Neurological: Negative for dizziness, tingling and tremors.  Endo/Heme/Allergies: Negative for environmental allergies. Does not bruise/bleed easily.  Psychiatric/Behavioral: Negative for depression.    No Known Allergies  Patient Active Problem List   Diagnosis Date Noted  . Lower GI bleed 07/22/2018  . 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 Atrial fibrillation ([place])   . BPH (benign prostatic hyperplasia)   . COPD (chronic obstructive pulmonary disease) ([place])   . Coronary artery disease   . Dementia   . Hypertension   . Lymphoma [facility])      Past Surgical History:  Procedure Laterality Date  . BOWEL RESECTION  07/13/2018   Procedure: SMALL BOWEL RESECTION;  Surgeon: Olean Ree, MD;  Location: [facility] ORS;  Service: General;;  . CORONARY ANGIOPLASTY WITH STENT PLACEMENT    . HERNIA REPAIR    . INGUINAL HERNIA REPAIR Right 07/13/2018   Procedure: HERNIA REPAIR INGUINAL ADULT;  Surgeon: Olean Ree, MD;  Location: [facility] ORS;  Service: General;  Laterality: Right;  . JOINT REPLACEMENT      Social History   Socioeconomic History  . Marital status: Married    Spouse name: Not on file  . Number of children: Not on file  . Years of education: Not on file  . Highest education level: Not on file  Occupational History  . Not on file  Social Needs  . Financial resource strain: Patient refused  . Food insecurity:    Worry: Patient refused    Inability: Patient refused  .  Transportation needs:    Medical: Patient refused    Non-medical: Patient refused  Tobacco Use  . Smoking status: Former Research scientist (life sciences)  . Smokeless tobacco: Never Used  Substance and Sexual Activity  . Alcohol use: Yes    Comment:  rarely  . Drug use: No  . Sexual activity: Not on file  Lifestyle  . Physical activity:    Days per week: Patient refused    Minutes per session: Patient refused  . Stress: Patient refused  Relationships  . Social connections:    Talks on phone: Patient refused    Gets together: Patient refused    Attends religious service: Patient refused    Active member of club or organization: Patient refused    Attends meetings of clubs or organizations: Patient refused    Relationship status: Patient refused  . Intimate partner violence:    Fear of current or ex partner: Patient refused    Emotionally abused: Patient refused    Physically abused: Patient refused    Forced sexual activity: Patient refused  Other Topics Concern  . Not on file  Social History Narrative  . Not on file     Family History  Problem Relation Age of Onset  . Stomach cancer Mother   . Heart attack Father   . Diabetes Father   . Cancer Brother      Current Facility-Administered Medications:  .  acetaminophen (TYLENOL) tablet 650 mg, 650 mg, Oral, Q6H PRN **OR** acetaminophen (TYLENOL) suppository 650 mg, 650 mg, Rectal, Q6H PRN, Leslye Peer, Richard, MD .  calcium carbonate (TUMS - dosed in mg elemental calcium) chewable tablet 200 mg of elemental calcium, 1 tablet, Oral, BID, Amelia Jo, MD, 200 mg of elemental calcium at 07/23/18 2127 .  finasteride (PROSCAR) tablet 5 mg, 5 mg, Oral, Daily, Leslye Peer, Richard, MD, 5 mg at 07/23/18 1110 .  fluticasone furoate-vilanterol (BREO ELLIPTA) 200-25 MCG/INH 1 puff, 1 puff, Inhalation, Daily, Leslye Peer, Richard, MD, 1 puff at 07/23/18 1110 .  magnesium oxide (MAG-OX) tablet 400 mg, 400 mg, Oral, Daily, Leslye Peer, Richard, MD, 400 mg at 07/23/18 1110 .  Melatonin TABS 5 mg, 1 tablet, Oral, QHS, Wieting, Richard, MD, 5 mg at 07/23/18 2127 .  metoprolol succinate (TOPROL-XL) 24 hr tablet 25 mg, 25 mg, Oral, BID, Leslye Peer, Richard, MD, 25 mg at 07/23/18 1942 .  ondansetron  (ZOFRAN) tablet 4 mg, 4 mg, Oral, Q6H PRN **OR** ondansetron (ZOFRAN) injection 4 mg, 4 mg, Intravenous, Q6H PRN, Leslye Peer, Richard, MD .  pantoprazole (PROTONIX) 80 mg in sodium chloride 0.9 % 250 mL (0.32 mg/mL) infusion, 8 mg/hr, Intravenous, Continuous, Wieting, Richard, MD, Last Rate: 25 mL/hr at 07/23/18 2123, 8 mg/hr at 07/23/18 2123 .  [START ON 07/26/2018] pantoprazole (PROTONIX) injection 40 mg, 40 mg, Intravenous, Q12H, Wieting, Richard, MD .  Derrill Memo ON 07/24/2018] potassium chloride SA (K-DUR,KLOR-CON) CR tablet 20 mEq, 20 mEq, Oral, Daily, Dustin Flock, MD .  pravastatin (PRAVACHOL) tablet 20 mg, 20 mg, Oral, q1800, Loletha Grayer, MD, 20 mg at 07/23/18 1855 .  tamsulosin (FLOMAX) capsule 0.4 mg, 0.4 mg, Oral, Daily, Leslye Peer, Richard, MD, 0.4 mg at 07/23/18 1109 .  tiotropium (SPIRIVA) inhalation capsule 18 mcg, 18 mcg, Inhalation, Daily, Loletha Grayer, MD, 18 mcg at 07/23/18 0855   Physical exam:  Vitals:   07/23/18 0547 07/23/18 0916 07/23/18 1302 07/23/18 2134  BP: 139/70 (!) 156/89 129/82 (!) 152/91  Pulse: 90 88 87 87  Resp: 20 16 14 18   Temp: 98.6 F (37 C) 98.3  F (36.8 C) 98.4 F (36.9 C) 98 F (36.7 C)  TempSrc: Oral Oral Oral Oral  SpO2: 100% 96% 97% 99%  Weight:      Height:       Physical Exam  Constitutional: He is oriented to person, place, and time. No distress.  HENT:  Head: Normocephalic and atraumatic.  Nose: Nose normal.  Mouth/Throat: Oropharynx is clear and moist. No oropharyngeal exudate.  Eyes: Pupils are equal, round, and reactive to light. EOM are normal. No scleral icterus.  Neck: Normal range of motion. Neck supple.  Cardiovascular: Normal rate and regular rhythm.  No murmur heard. Pulmonary/Chest: Effort normal. No respiratory distress. He has no rales. He exhibits no tenderness.  Abdominal: Soft. Bowel sounds are normal. He exhibits no distension. There is no tenderness.  Musculoskeletal: Normal range of motion. He exhibits no edema.   Neurological: He is alert and oriented to person, place, and time. No cranial nerve deficit. He exhibits normal muscle tone. Coordination normal.  Skin: Skin is warm and dry. He is not diaphoretic. No erythema.  Psychiatric: Affect normal.        CMP Latest Ref Rng & Units 07/23/2018  Glucose 70 - 99 mg/dL 108(H)  BUN 8 - 23 mg/dL 14  Creatinine 0.61 - 1.24 mg/dL 1.12  Sodium 135 - 145 mmol/L 143  Potassium 3.5 - 5.1 mmol/L 3.3(L)  Chloride 98 - 111 mmol/L 109  CO2 22 - 32 mmol/L 28  Calcium 8.9 - 10.3 mg/dL 8.5(L)  Total Protein 6.5 - 8.1 g/dL -  Total Bilirubin 0.3 - 1.2 mg/dL -  Alkaline Phos 38 - 126 U/L -  AST 15 - 41 U/L -  ALT 0 - 44 U/L -   CBC Latest Ref Rng & Units 07/23/2018  WBC 3.8 - 10.6 K/uL 11.6(H)  Hemoglobin 13.0 - 18.0 g/dL 8.7(L)  Hematocrit 40.0 - 52.0 % 25.0(L)  Platelets 150 - 440 K/uL 273    Ct Abdomen Pelvis Wo Contrast  Result Date: 07/13/2018 CLINICAL DATA:  77 year old male noted hernia 1 week ago becoming larger over the weekend and painful. Initial encounter. EXAM: CT ABDOMEN AND PELVIS WITHOUT CONTRAST TECHNIQUE: Multidetector CT imaging of the abdomen and pelvis was performed following the standard protocol without IV contrast. COMPARISON:  None. FINDINGS: Lower chest: Basilar subsegmental atelectasis/scarring. Heart size within normal limits. Prominent coronary artery calcifications. Aortic valve calcifications. Trace pericardial fluid. Hepatobiliary: Taking into account limitation by non contrast imaging, no worrisome hepatic lesion. No calcified gallstones. Pancreas: Taking into account limitation by non contrast imaging, no worrisome pancreatic mass or inflammation. Spleen: Taking into account limitation by non contrast imaging, no worrisome splenic mass which is top-normal in length. Adrenals/Urinary Tract: No obstructing stone or hydronephrosis. Taking into account limitation by non contrast imaging, no worrisome renal mass. Bilateral adrenal  low-density structures most suggestive of small adenomas slightly larger on the left measuring up to 2 cm. Dome of bladder extends towards the entrance of the hernia. Bladder decompressed. Stomach/Bowel: Right inguinal canal distal small bowel containing hernia appears markedly inflamed with mixed density surrounding fluid (possible associated hemorrhage) and infiltration of surrounding fat planes. This is causing proximal obstruction. Finding consistent with incarcerated hernia. Cannot determine viability of bowel. No pneumatosis, free air or venous gas currently detected. Fat and vessels extend into this hernia. Prior hernia surgery with clips seen along the medial aspect of the hernia. Dome of bladder extends towards the entrance of the hernia. Vascular/Lymphatic: Atherosclerotic changes aorta and aortic branch vessels.  No aortic aneurysm noted. Scattered normal size lymph nodes. Reproductive: Slightly prominent size prostate gland. Other: No free intraperitoneal air. Musculoskeletal: Moderate degenerative changes lower thoracic and lower lumbar spine. Hip joint degenerative changes. Nonspecific osseous lesion left inferior aspect of the L3 vertebra. Smaller nonspecific lesion right ilium. IMPRESSION: 1. Right inguinal canal distal small bowel containing hernia appears markedly inflamed with mixed density surrounding fluid (possible associated hemorrhage) and infiltration of surrounding fat planes. This is causing proximal obstruction. Findings consistent with incarcerated hernia. Cannot determine viability of bowel. No pneumatosis, free air or venous gas currently detected. Fat and vessels extend into this hernia. Prior hernia surgery with clips seen along the medial aspect of the hernia. Dome of bladder extends towards the entrance of the hernia. 2. Bilateral adrenal low-density structures most suggestive of small adenomas slightly larger on the left measuring up to 2 cm. 3. Aortic Atherosclerosis  (ICD10-I70.0). Atherosclerotic changes aorta, aortic branch vessels, iliac arteries and femoral arteries. No abdominal aortic aneurysm. 4. Prominent coronary artery calcifications. 5. Slightly enlarged prostate gland. 6. Moderate degenerative changes lower thoracic and lower lumbar spine. Hip joint degenerative changes. 7. Nonspecific osseous lesion left inferior aspect of the L3 vertebra. Smaller nonspecific lesion right ilium. These results were called by telephone at the time of interpretation on 07/13/2018 at 11:10 am to Dr. Lavonia Drafts , who verbally acknowledged these results. Electronically Signed   By: Genia Del M.D.   On: 07/13/2018 11:27    Assessment and plan- Patient is a 77 y.o. male with recent history of hernia repair presented to hospital due to bloody bowel movement.  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  #Acute leukemia, likely secondary to GI bleeding.  Normal LDH, normal bilirubin.  Less likely hemolysis.  He is boarded for upper endoscopy if negative will have colonoscopy work-up.  Currently off anticoagulation  due to bleeding risk. 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  His case was presented on tumor board 07/23/2018.  On 07/13/2018, prior to the surgery, patient has normal hemoglobin and a platelet counts.  No splenomegaly[size at the high normal limit], asymptomatic.  Recommend watchful waiting. Additional work-up can be done outpatient after acute issues resolved.  Discussed with patient.   Thank you for allowing me to participate in the care of this patient.  Total face to face encounter time for this patient visit was 70 min. >50% of the time was  spent in counseling and coordination of care.    Earlie Server, MD, PhD Hematology Oncology [facility] Pager- [phone] 07/23/2018

## 2018-07-23 NOTE — Progress Notes (Signed)
[place] at [facility]                                                                                                                                                                                  Patient Demographics   Victor Ramos, is a 77 y.o. male, DOB - 1941/09/05, NIO:[id number]  Admit date - 07/22/2018   Admitting Physician Loletha Grayer, MD  Outpatient Primary MD for the patient is [facility] [place]   LOS - 1  Subjective:  Patient admitted with worsening anemia.  Recent surgery for strangulated hernia   Review of Systems:   CONSTITUTIONAL: No documented fever. No fatigue, weakness. No weight gain, no weight loss.  EYES: No blurry or double vision.  ENT: No tinnitus. No postnasal drip. No redness of the oropharynx.  RESPIRATORY: No cough, no wheeze, no hemoptysis. No dyspnea.  CARDIOVASCULAR: No chest pain. No orthopnea. No palpitations. No syncope.  GASTROINTESTINAL: No nausea, no vomiting or diarrhea. No abdominal pain. + melena or hematochezia.  GENITOURINARY: No dysuria or hematuria.  ENDOCRINE: No polyuria or nocturia. No heat or cold intolerance.  HEMATOLOGY: No anemia. No bruising. No bleeding.  INTEGUMENTARY: No rashes. No lesions.  MUSCULOSKELETAL: No arthritis. No swelling. No gout.  NEUROLOGIC: No numbness, tingling, or ataxia. No seizure-type activity.  PSYCHIATRIC: No anxiety. No insomnia. No ADD.    Vitals:   Vitals:   07/22/18 2241 07/23/18 0547 07/23/18 0916 07/23/18 1302  BP: (!) 164/91 139/70 (!) 156/89 129/82  Pulse: (!) 102 90 88 87  Resp: 12 20 16 14   Temp: 98.3 F (36.8 C) 98.6 F (37 C) 98.3 F (36.8 C) 98.4 F (36.9 C)  TempSrc: Oral Oral Oral Oral  SpO2: 100% 100% 96% 97%  Weight:      Height:        Wt Readings from Last 3 Encounters:  07/22/18 94.2 kg  07/22/18 93 kg  07/20/18 91.2 kg     Intake/Output Summary (Last 24 hours) at 07/23/2018 1411 Last data filed at 07/23/2018  1114 Gross per 24 hour  Intake 1088 ml  Output 1200 ml  Net -112 ml    Physical Exam:   GENERAL: Pleasant-appearing in no apparent distress.  HEAD, EYES, EARS, NOSE AND THROAT: Atraumatic, normocephalic. Extraocular muscles are intact. Pupils equal and reactive to light. Sclerae anicteric. No conjunctival injection. No oro-pharyngeal erythema.  NECK: Supple. There is no jugular venous distention. No bruits, no lymphadenopathy, no thyromegaly.  HEART: Regular rate and rhythm,. No murmurs, no rubs, no clicks.  LUNGS: Clear to auscultation bilaterally. No rales or rhonchi. No wheezes.  ABDOMEN: Soft, flat, nontender, nondistended. Has good bowel sounds.  No hepatosplenomegaly appreciated.  EXTREMITIES: No evidence of any cyanosis, clubbing, or peripheral edema.  +2 pedal and radial pulses bilaterally.  NEUROLOGIC: The patient is alert, awake, and oriented x3 with no focal motor or sensory deficits appreciated bilaterally.  SKIN: Moist and warm with no rashes appreciated.  Psych: Not anxious, depressed LN: No inguinal LN enlargement    Antibiotics   Anti-infectives (From admission, onward)   None      Medications   Scheduled Meds: . calcium carbonate  1 tablet Oral BID  . finasteride  5 mg Oral Daily  . fluticasone furoate-vilanterol  1 puff Inhalation Daily  . magnesium oxide  400 mg Oral Daily  . Melatonin  1 tablet Oral QHS  . metoprolol succinate  25 mg Oral BID  . [START ON 07/26/2018] pantoprazole  40 mg Intravenous Q12H  . potassium chloride  10 mEq Oral Once  . [START ON 07/24/2018] potassium chloride  20 mEq Oral Daily  . pravastatin  20 mg Oral q1800  . tamsulosin  0.4 mg Oral Daily  . tiotropium  18 mcg Inhalation Daily   Continuous Infusions: . pantoprozole (PROTONIX) infusion 8 mg/hr (07/23/18 1114)   PRN Meds:.acetaminophen **OR** acetaminophen, ondansetron **OR** ondansetron (ZOFRAN) IV   Data Review:   Micro Results No results found for this or any  previous visit (from the past 240 hour(s)).  Radiology Reports Ct Abdomen Pelvis Wo Contrast  Result Date: 07/13/2018 CLINICAL DATA:  77 year old male noted hernia 1 week ago becoming larger over the weekend and painful. Initial encounter. EXAM: CT ABDOMEN AND PELVIS WITHOUT CONTRAST TECHNIQUE: Multidetector CT imaging of the abdomen and pelvis was performed following the standard protocol without IV contrast. COMPARISON:  None. FINDINGS: Lower chest: Basilar subsegmental atelectasis/scarring. Heart size within normal limits. Prominent coronary artery calcifications. Aortic valve calcifications. Trace pericardial fluid. Hepatobiliary: Taking into account limitation by non contrast imaging, no worrisome hepatic lesion. No calcified gallstones. Pancreas: Taking into account limitation by non contrast imaging, no worrisome pancreatic mass or inflammation. Spleen: Taking into account limitation by non contrast imaging, no worrisome splenic mass which is top-normal in length. Adrenals/Urinary Tract: No obstructing stone or hydronephrosis. Taking into account limitation by non contrast imaging, no worrisome renal mass. Bilateral adrenal low-density structures most suggestive of small adenomas slightly larger on the left measuring up to 2 cm. Dome of bladder extends towards the entrance of the hernia. Bladder decompressed. Stomach/Bowel: Right inguinal canal distal small bowel containing hernia appears markedly inflamed with mixed density surrounding fluid (possible associated hemorrhage) and infiltration of surrounding fat planes. This is causing proximal obstruction. Finding consistent with incarcerated hernia. Cannot determine viability of bowel. No pneumatosis, free air or venous gas currently detected. Fat and vessels extend into this hernia. Prior hernia surgery with clips seen along the medial aspect of the hernia. Dome of bladder extends towards the entrance of the hernia. Vascular/Lymphatic: Atherosclerotic  changes aorta and aortic branch vessels. No aortic aneurysm noted. Scattered normal size lymph nodes. Reproductive: Slightly prominent size prostate gland. Other: No free intraperitoneal air. Musculoskeletal: Moderate degenerative changes lower thoracic and lower lumbar spine. Hip joint degenerative changes. Nonspecific osseous lesion left inferior aspect of the L3 vertebra. Smaller nonspecific lesion right ilium. IMPRESSION: 1. Right inguinal canal distal small bowel containing hernia appears markedly inflamed with mixed density surrounding fluid (possible associated hemorrhage) and infiltration of surrounding fat planes. This is causing proximal obstruction. Findings consistent with incarcerated hernia. Cannot determine viability of bowel. No pneumatosis, free air or venous  gas currently detected. Fat and vessels extend into this hernia. Prior hernia surgery with clips seen along the medial aspect of the hernia. Dome of bladder extends towards the entrance of the hernia. 2. Bilateral adrenal low-density structures most suggestive of small adenomas slightly larger on the left measuring up to 2 cm. 3. Aortic Atherosclerosis (ICD10-I70.0). Atherosclerotic changes aorta, aortic branch vessels, iliac arteries and femoral arteries. No abdominal aortic aneurysm. 4. Prominent coronary artery calcifications. 5. Slightly enlarged prostate gland. 6. Moderate degenerative changes lower thoracic and lower lumbar spine. Hip joint degenerative changes. 7. Nonspecific osseous lesion left inferior aspect of the L3 vertebra. Smaller nonspecific lesion right ilium. These results were called by telephone at the time of interpretation on 07/13/2018 at 11:10 am to Dr. Lavonia Drafts , who verbally acknowledged these results. Electronically Signed   By: Genia Del M.D.   On: 07/13/2018 11:27     CBC Recent Labs  Lab 07/17/18 0345 07/20/18 1537 07/22/18 1529 07/22/18 1651 07/22/18 2336 07/23/18 0519  WBC 8.5 15.6* 11.6*  11.4*  --  11.6*  HGB 12.3* 8.8* 7.8* 7.9* 8.4* 8.7*  HCT 35.4* 24.9* 23.2* 23.1*  --  25.0*  PLT 162 352 337 337  --  273  MCV 91.6 90.5 91.6 91.5  --  90.4  MCH 31.7 31.9 31.0 31.3  --  31.5  MCHC 34.6 35.2 33.8 34.2  --  34.8  RDW 13.1 13.5 13.5 13.5  --  13.5  LYMPHSABS  --  3.9*  --   --   --   --   MONOABS  --  1.1*  --   --   --   --   EOSABS  --  0.2  --   --   --   --   BASOSABS  --  0.1  --   --   --   --     Chemistries  Recent Labs  Lab 07/17/18 0345 07/20/18 1537 07/22/18 1651 07/23/18 0519  NA 141 138 140 143  K 3.1* 3.3* 3.3* 3.3*  CL 107 104 104 109  CO2 27 25 27 28   GLUCOSE 124* 159* 121* 108*  BUN 13 26* 18 14  CREATININE 0.95 1.17 1.18 1.12  CALCIUM 8.1* 9.0 8.9 8.5*  AST  --  22 27  --   ALT  --  33 34  --   ALKPHOS  --  55 56  --   BILITOT  --  0.3 0.3  --    ------------------------------------------------------------------------------------------------------------------ estimated creatinine clearance is 64.4 mL/min (by C-G formula based on SCr of 1.12 mg/dL). ------------------------------------------------------------------------------------------------------------------ No results for input(s): HGBA1C in the last 72 hours. ------------------------------------------------------------------------------------------------------------------ No results for input(s): CHOL, HDL, LDLCALC, TRIG, CHOLHDL, LDLDIRECT in the last 72 hours. ------------------------------------------------------------------------------------------------------------------ No results for input(s): TSH, T4TOTAL, T3FREE, THYROIDAB in the last 72 hours.  Invalid input(s): FREET3 ------------------------------------------------------------------------------------------------------------------ Recent Labs    07/22/18 2336  FERRITIN 80    Coagulation profile Recent Labs  Lab 07/20/18 1537  INR 1.18    No results for input(s): DDIMER in the last 72 hours.  Cardiac  Enzymes No results for input(s): CKMB, TROPONINI, MYOGLOBIN in the last 168 hours.  Invalid input(s): CK ------------------------------------------------------------------------------------------------------------------ Invalid input(s): [place]   1.  Acute blood loss anemia.    Status post transfusion hemoglobin currently stable GI evaluation done plan for EGD [place] 2.  GI bleed with burgundy stools.    Continue Protonix drip.  Hold anticoagulation 3.  Recent surgery for strangulated hernia.  Pathology showing hernia sac lymphoma.     4.  Atrial fibrillation and hypertension.  Continue metoprolol twice daily.  Hold anticoagulation.  Stroke risk higher off anticoagulation but this is contraindicated now with GI bleed. 5.  COPD.  Respiratory status stable continue inhalers 6.  History of dementia. 7.  History of coronary artery disease     Code Status Orders  (From admission, onward)         Start     Ordered   07/22/18 2023  Full code  Continuous     07/22/18 2022        Code Status History    Date Active Date Inactive Code Status Order ID Comments User Context   07/13/2018 1225 07/17/2018 2130 Full Code [id number]  Olean Ree, MD ED           Consults GI  DVT Prophylaxis SCDs Lab Results  Component Value Date   PLT 273 07/23/2018     Time Spent in minutes   64min  Greater than 50% of time spent in care coordination and counseling patient regarding the condition and plan of care.   Dustin Flock M.D on 07/23/2018 at 2:11 PM  Between 7am to 6pm - Pager - [phone]  After 6pm go to [email or web address] - Proofreader  [employer]   Office  [phone]

## 2018-07-23 NOTE — Consult Note (Signed)
[facility] Consult Note  [facility] 11-Mar-1941  [id number].    Requesting MD: Dr. Loletha Grayer, MD Chief Complaint/Reason for Consult: GI Bleed, Post-op Hernia Repair, Lymphoma  HPI:  Victor Ramos is a 77 y.o. male who underwent open right inguinal hernia repair with small bowel resection with primary anastomosis on 07/13/18 with Dr. Hampton Abbot, MD. He did well in the post-operative period initially and was discharged home on 09/20. Per wife, he started to have bright red-maroon stools on 09/21. He followed up on 09/23 and again was complaining of blood in his stool, weakness, and dizziness. His Hgb was rechecked and was 8.8 which was down from 12.3 at discharge. He followed up again on 09/25 and at that time his hgb was 7.9 and he continued to be symptomatic. 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   He presented to the ED on 07/22/18 for admission, GI bleed work up, and oncology consultation. This morning, he is frustrated "with how this place is run." He notes that he has had about 2 bloody bowel movements a day since discharge from the hospital on 09/20. He denied any abdominal pain, nausea, or emesis. He endorses some right sided scrotal swelling, but this is non painful. No complaints with urination. No additional complaints this morning. Unsure if he has had a colonoscopy in the past.   ROS: Review of Systems  Constitutional: Negative for chills and fever.  Respiratory: Negative for shortness of breath and wheezing.   Cardiovascular: Negative for chest pain and palpitations.  Gastrointestinal: Positive for blood in stool and diarrhea. Negative for abdominal pain, nausea and vomiting.  Genitourinary: Negative for dysuria and hematuria.       + Scrotal Swelling  All other systems reviewed and are negative.   Family History  Problem  Relation Age of Onset  . Stomach cancer Mother   . Heart attack Father   . Diabetes Father   . Cancer Brother     Past Medical History:  Diagnosis Date  . Atrial fibrillation ([place])   . BPH (benign prostatic hyperplasia)   . COPD (chronic obstructive pulmonary disease) ([place])   . Coronary artery disease   . Dementia   . Hypertension   . Lymphoma [facility])     Past Surgical History:  Procedure Laterality Date  . BOWEL RESECTION  07/13/2018   Procedure: SMALL BOWEL RESECTION;  Surgeon: Olean Ree, MD;  Location: [facility] ORS;  Service: General;;  . CORONARY ANGIOPLASTY WITH STENT PLACEMENT    . HERNIA REPAIR    . INGUINAL HERNIA REPAIR Right 07/13/2018   Procedure: HERNIA REPAIR INGUINAL ADULT;  Surgeon: Olean Ree, MD;  Location: [facility] ORS;  Service: General;  Laterality: Right;  . JOINT REPLACEMENT      Social History:  reports that he has quit smoking. He has never used smokeless tobacco. He reports that he drinks alcohol. He reports that he does not use drugs.  Allergies: No Known Allergies  Medications Prior to Admission  Medication Sig Dispense Refill  . amLODipine (NORVASC) 10 MG tablet Take 10 mg by mouth daily.    Marland Kitchen aspirin 81 MG tablet Take 81 mg by mouth daily.    . budesonide-formoterol (SYMBICORT) 160-4.5 MCG/ACT inhaler Inhale 2 puffs into the lungs 2 (two) times daily.    . dabigatran (PRADAXA) 150 MG CAPS capsule Take 150 mg by mouth 2 (two)  times daily.    . finasteride (PROSCAR) 5 MG tablet Take 5 mg by mouth daily.    Marland Kitchen lisinopril (PRINIVIL,ZESTRIL) 40 MG tablet Take 40 mg by mouth daily.    . magnesium 30 MG tablet Take 250 mg by mouth daily.     . Melatonin 5 MG TABS Take 1 tablet by mouth at bedtime.     . metoprolol succinate (TOPROL-XL) 25 MG 24 hr tablet Take 25 mg by mouth 2 (two) times daily.    . potassium chloride (K-DUR,KLOR-CON) 10 MEQ tablet Take 10 mEq by mouth daily.     . pravastatin (PRAVACHOL) 40 MG tablet Take 20 mg by mouth daily.    .  tamsulosin (FLOMAX) 0.4 MG CAPS capsule Take 0.4 mg by mouth daily.     Marland Kitchen tiotropium (SPIRIVA) 18 MCG inhalation capsule Place 18 mcg into inhaler and inhale daily.      Blood pressure (!) 156/89, pulse 88, temperature 98.3 F (36.8 C), temperature source Oral, resp. rate 16, height '5\' 8"'  (1.727 m), weight 94.2 kg, SpO2 96 %. Physical Exam: Physical Exam  Constitutional: He is oriented to person, place, and time. He appears well-developed and well-nourished. No distress.  HENT:  Head: Normocephalic and atraumatic.  Eyes: Pupils are equal, round, and reactive to light. EOM are normal. No scleral icterus.  Cardiovascular: Normal rate, regular rhythm and normal heart sounds. Exam reveals no gallop and no friction rub.  No murmur heard. Pulmonary/Chest: Effort normal and breath sounds normal. No respiratory distress. He has no wheezes. He has no rales.  Abdominal: Soft. He exhibits no distension and no mass. There is no tenderness. There is no rigidity, no rebound and no guarding.  Right inguinal incision is well healed without erythema or drainage  Genitourinary: Penis normal. Right testis shows swelling. Right testis shows no mass and no tenderness. Left testis shows no mass and no tenderness. Circumcised.  Genitourinary Comments: Swelling to the right scrotum, firm, non-tender  Musculoskeletal: Normal range of motion. He exhibits no edema.  Neurological: He is alert and oriented to person, place, and time.  Skin: Skin is warm and dry. He is not diaphoretic. No erythema.  Psychiatric: He has a normal mood and affect.    Results for orders placed or performed during the hospital encounter of 07/22/18 (from the past 48 hour(s))  ABO/Rh     Status: None   Collection Time: 07/22/18  3:29 PM  Result Value Ref Range   ABO/RH(D)      A POS Performed at [facility], [address] [place], [place] [address]   Comprehensive metabolic panel     Status: Abnormal   Collection Time:  07/22/18  4:51 PM  Result Value Ref Range   Sodium 140 135 - 145 mmol/L   Potassium 3.3 (L) 3.5 - 5.1 mmol/L   Chloride 104 98 - 111 mmol/L   CO2 27 22 - 32 mmol/L   Glucose, Bld 121 (H) 70 - 99 mg/dL   BUN 18 8 - 23 mg/dL   Creatinine, Ser 1.18 0.61 - 1.24 mg/dL   Calcium 8.9 8.9 - 10.3 mg/dL   Total Protein 5.8 (L) 6.5 - 8.1 g/dL   Albumin 3.2 (L) 3.5 - 5.0 g/dL   AST 27 15 - 41 U/L   ALT 34 0 - 44 U/L   Alkaline Phosphatase 56 38 - 126 U/L   Total Bilirubin 0.3 0.3 - 1.2 mg/dL   GFR calc non Af Amer 59 (L) >  60 mL/min   GFR calc Af Amer >60 >60 mL/min    Comment: (NOTE) The eGFR has been calculated using the CKD EPI equation. This calculation has not been validated in all clinical situations. eGFR's persistently <60 mL/min signify possible Chronic Kidney Disease.    Anion gap 9 5 - 15    Comment: Performed at [facility], [address] [place], [place] [address]  CBC     Status: Abnormal   Collection Time: 07/22/18  4:51 PM  Result Value Ref Range   WBC 11.4 (H) 3.8 - 10.6 K/uL   RBC 2.52 (L) 4.40 - 5.90 MIL/uL   Hemoglobin 7.9 (L) 13.0 - 18.0 g/dL   HCT 23.1 (L) 40.0 - 52.0 %   MCV 91.5 80.0 - 100.0 fL   MCH 31.3 26.0 - 34.0 pg   MCHC 34.2 32.0 - 36.0 g/dL   RDW 13.5 11.5 - 14.5 %   Platelets 337 150 - 440 K/uL    Comment: Performed at [facility], [address] [place], [place] [address]  Type and screen [place]     Status: None   Collection Time: 07/22/18  4:51 PM  Result Value Ref Range   ABO/RH(D) A POS    Antibody Screen NEG    Sample Expiration 07/25/2018    Unit Number [id number]    Blood Component Type RED CELLS,LR    Unit division 00    Status of Unit ISSUED,FINAL    Transfusion Status OK TO TRANSFUSE    Crossmatch Result      Compatible Performed at [facility], [address], [place], [place] [address]   Prepare RBC     Status: None   Collection Time: 07/22/18  7:23 PM  Result Value  Ref Range   Order Confirmation      ORDER PROCESSED BY BLOOD BANK Performed at [facility], [address] [place], [place] [address]   Ferritin     Status: None   Collection Time: 07/22/18 11:36 PM  Result Value Ref Range   Ferritin 80 24 - 336 ng/mL    Comment: Performed at [facility], [address] [place], [place] [address]  Hemoglobin     Status: Abnormal   Collection Time: 07/22/18 11:36 PM  Result Value Ref Range   Hemoglobin 8.4 (L) 13.0 - 18.0 g/dL    Comment: Performed at [facility], [address] [place], [place] [address]  Basic metabolic panel     Status: Abnormal   Collection Time: 07/23/18  5:19 AM  Result Value Ref Range   Sodium 143 135 - 145 mmol/L   Potassium 3.3 (L) 3.5 - 5.1 mmol/L   Chloride 109 98 - 111 mmol/L   CO2 28 22 - 32 mmol/L   Glucose, Bld 108 (H) 70 - 99 mg/dL   BUN 14 8 - 23 mg/dL   Creatinine, Ser 1.12 0.61 - 1.24 mg/dL   Calcium 8.5 (L) 8.9 - 10.3 mg/dL   GFR calc non Af Amer >60 >60 mL/min   GFR calc Af Amer >60 >60 mL/min    Comment: (NOTE) The eGFR has been calculated using the CKD EPI equation. This calculation has not been validated in all clinical situations. eGFR's persistently <60 mL/min signify possible Chronic Kidney Disease.    Anion gap 6 5 - 15    Comment: Performed at [facility], [address] [place], [place] [address]  CBC  Status: Abnormal   Collection Time: 07/23/18  5:19 AM  Result Value Ref Range   WBC 11.6 (H) 3.8 - 10.6 K/uL   RBC 2.77 (L) 4.40 - 5.90 MIL/uL   Hemoglobin 8.7 (L) 13.0 - 18.0 g/dL   HCT 25.0 (L) 40.0 - 52.0 %   MCV 90.4 80.0 - 100.0 fL   MCH 31.5 26.0 - 34.0 pg   MCHC 34.8 32.0 - 36.0 g/dL   RDW 13.5 11.5 - 14.5 %   Platelets 273 150 - 440 K/uL    Comment: Performed at [facility], [address] [place], [place] [address]   No results found.    Assessment/Plan  GI Bleeding - POD10 from right open inguinal hernia repair  with small bowel resection and primary anastomosis.  - He continues to notice blood in his stool for the last 6 days now, hgb 8.7 this morning after receiving 1 unit in ED yesterday. VSS stable this morning. GI has been consulted for this, may need EGD + Colonoscopy to evaluate GI bleed, appreciate their help. - Continue to trend Hgb, transfuse as needed.  - Pathology from hernia sac concerning for low-grade B-cell lymphoma without small bowel involvement. Oncology consulted, will present this afternoon at tumor board to discuss steps for staging and management.  - Medicine primary. Appreciate their involvement.    Edison Simon, PA-C [facility] 07/23/2018, 11:27 AM [phone] M-F: 7am - 4pm

## 2018-07-23 NOTE — Consult Note (Signed)
Victor Ramos , MD [address], [place], [place], [place], [address] [address] [place], [place], [place], [address] Phone: [phone]  Fax: [phone]  Consultation  Referring Provider:   Dr Earleen Ramos  Primary Care Physician:  [facility] Primary Gastroenterologist:  None          Reason for Consultation:     GI bleed   Date of Admission:  07/22/2018 Date of Consultation:  07/23/2018         HPI:   Victor Ramos is a 77 y.o. male presented to the hospital on 07/22/2018 with a GI bleed.  He was recently in the hospital and had surgery done for a strangulated hernia.  Started on aspirin and Pradaxa and sent home.  Was brought in the hospital for bloody bowel movements that began on Saturday.  Has been off aspirin and Pradaxa since.  He went in for his appointment to have the staples taken out and on routine lab work was found to the be anemic and was sent to the hospital.  His hemoglobin in 07/17/2018 was 12.3 g and on admission was down to 7.9 g.  Ferritin is normal at 80.  MCV is 91.  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  That time he also had a part of the small bowel resected which showed focal ischemia consistent with history of hernia and strangulate a small intestine.  When I went to see him he was at his wife's.  His wife states that he is having issues with memory and dementia.  According to the wife the Pradaxa has been stopped on Sunday.  He had multiple bloody bowel movements from start today.  Last bowel movement was yesterday.  Denies any NSAID use.  No abdominal pain.  No hematemesis.  Last colonoscopy was 10 years back and recalls was normal.  Past Medical History:  Diagnosis Date  . Atrial fibrillation ([place])   . BPH (benign prostatic hyperplasia)   . COPD (chronic obstructive pulmonary disease) ([place])   . Coronary artery disease   . Dementia   . Hypertension   . Lymphoma [facility])     Past Surgical History:  Procedure Laterality  Date  . BOWEL RESECTION  07/13/2018   Procedure: SMALL BOWEL RESECTION;  Surgeon: Olean Ree, MD;  Location: [facility] ORS;  Service: General;;  . CORONARY ANGIOPLASTY WITH STENT PLACEMENT    . HERNIA REPAIR    . INGUINAL HERNIA REPAIR Right 07/13/2018   Procedure: HERNIA REPAIR INGUINAL ADULT;  Surgeon: Olean Ree, MD;  Location: [facility] ORS;  Service: General;  Laterality: Right;  . JOINT REPLACEMENT      Prior to Admission medications   Medication Sig Start Date End Date Taking? Authorizing Provider  amLODipine (NORVASC) 10 MG tablet Take 10 mg by mouth daily.    [provider]  aspirin 81 MG tablet Take 81 mg by mouth daily.    [provider]  budesonide-formoterol (SYMBICORT) 160-4.5 MCG/ACT inhaler Inhale 2 puffs into the lungs 2 (two) times daily.    [provider]  dabigatran (PRADAXA) 150 MG CAPS capsule Take 150 mg by mouth 2 (two) times daily.    [provider]  finasteride (PROSCAR) 5 MG tablet Take 5 mg by mouth daily.    [provider]  lisinopril (PRINIVIL,ZESTRIL) 40 MG tablet Take 40 mg by mouth daily.    [provider]  magnesium 30 MG tablet Take 250 mg by mouth daily.  [provider]  Melatonin 5 MG TABS Take 1 tablet by mouth at bedtime.     [provider]  metoprolol succinate (TOPROL-XL) 25 MG 24 hr tablet Take 25 mg by mouth 2 (two) times daily.    [provider]  potassium chloride (K-DUR,KLOR-CON) 10 MEQ tablet Take 10 mEq by mouth daily.     [provider]  pravastatin (PRAVACHOL) 40 MG tablet Take 20 mg by mouth daily.    [provider]  tamsulosin (FLOMAX) 0.4 MG CAPS capsule Take 0.4 mg by mouth daily.     [provider]  tiotropium (SPIRIVA) 18 MCG inhalation capsule Place 18 mcg into inhaler and inhale daily.    [provider]    Family History  Problem Relation Age of Onset  . Stomach cancer Mother   . Heart attack Father   .  Diabetes Father   . Cancer Brother      Social History   Tobacco Use  . Smoking status: Former Research scientist (life sciences)  . Smokeless tobacco: Never Used  Substance Use Topics  . Alcohol use: Yes    Comment: rarely  . Drug use: No    Allergies as of 07/22/2018  . (No Known Allergies)    Review of Systems:    All systems reviewed and negative except where noted in HPI.   Physical Exam:  Vital signs in last 24 hours: Temp:  [97.9 F (36.6 C)-98.7 F (37.1 C)] 98.6 F (37 C) (09/26 0547) Pulse Rate:  [89-112] 90 (09/26 0547) Resp:  [11-20] 20 (09/26 0547) BP: (134-166)/(64-97) 139/70 (09/26 0547) SpO2:  [96 %-100 %] 100 % (09/26 0547) Weight:  [92 kg-94.2 kg] 94.2 kg (09/25 2135) Last BM Date: 07/22/18 General:   Pleasant, cooperative in NAD Head:  Normocephalic and atraumatic. Eyes:   No icterus.   Conjunctiva pink. PERRLA. Ears:  Normal auditory acuity. Neck:  Supple; no masses or thyroidomegaly Lungs: Respirations even and unlabored. Lungs clear to auscultation bilaterally.   No wheezes, crackles, or rhonchi.  Heart:  Regular rate and rhythm;  Without murmur, clicks, rubs or gallops Abdomen:  Soft, nondistended, nontender. Normal bowel sounds. No appreciable masses or hepatomegaly.  No rebound or guarding.  Neurologic:  Alert and oriented x3;  grossly normal neurologically. Skin:  Intact without significant lesions or rashes. Cervical Nodes:  No significant cervical adenopathy. Psych:  Alert and cooperative. Normal affect.  LAB RESULTS: Recent Labs    07/22/18 1529 07/22/18 1651 07/22/18 2336 07/23/18 0519  WBC 11.6* 11.4*  --  11.6*  HGB 7.8* 7.9* 8.4* 8.7*  HCT 23.2* 23.1*  --  25.0*  PLT 337 337  --  273   BMET Recent Labs    07/20/18 1537 07/22/18 1651 07/23/18 0519  NA 138 140 143  K 3.3* 3.3* 3.3*  CL 104 104 109  CO2 25 27 28   GLUCOSE 159* 121* 108*  BUN 26* 18 14  CREATININE 1.17 1.18 1.12  CALCIUM 9.0 8.9 8.5*   LFT Recent Labs    07/22/18 1651    PROT 5.8*  ALBUMIN 3.2*  AST 27  ALT 34  ALKPHOS 56  BILITOT 0.3   PT/INR Recent Labs    07/20/18 1537  LABPROT 14.9  INR 1.18    STUDIES: No results found.    Impression / Plan:   Victor Ramos is a 77 y.o. y/o male who has been on aspirin and Pradaxa presented to the hospital with bloody bowel movement since Saturday.  Recently had surgery for a right strangulated inguinal hernia.  Which shows lymphoma in the pathology specimen.  He had been on Pradaxa and aspirin which has been stopped.  Acute drop in hemoglobin from baseline by about 4 g.  07/20/2018 his BUN/creatinine ratio was elevated suggesting probably this could be upper GI bleed but it would be hard to say.  I suggest that we transfuse him.  Monitor CBC and proceed with an upper endoscopy tomorrow.  If upper endoscopy is negative then he may need a colonoscopy on Saturday.  I have discussed alternative options, risks & benefits,  which include, but are not limited to, bleeding, infection, perforation,respiratory complication & drug reaction.  The patient agrees with this plan & written consent will be obtained.     Thank you for involving me in the care of this patient.      LOS: 1 day   Victor Bellows, MD  07/23/2018, 8:44 AM

## 2018-07-24 ENCOUNTER — Encounter
Admission: EM | Disposition: A | Discharge status: Home / Self Care | Payer: Self-pay | Source: Home / Self Care | Attending: Internal Medicine

## 2018-07-24 ENCOUNTER — Encounter: Payer: Self-pay | Admitting: Anesthesiology

## 2018-07-24 ENCOUNTER — Inpatient Hospital Stay: Payer: Medicare Other | Admitting: Anesthesiology

## 2018-07-24 DIAGNOSIS — C9110 Chronic lymphocytic leukemia of B-cell type not having achieved remission: Secondary | ICD-10-CM

## 2018-07-24 DIAGNOSIS — D649 Anemia, unspecified: Secondary | ICD-10-CM

## 2018-07-24 DIAGNOSIS — D62 Acute posthemorrhagic anemia: Secondary | ICD-10-CM

## 2018-07-24 HISTORY — PX: ESOPHAGOGASTRODUODENOSCOPY (EGD) WITH PROPOFOL: SHX5813

## 2018-07-24 LAB — CBC WITH DIFFERENTIAL/PLATELET
BASOS PCT: 0 %
Basophils Absolute: 0 10*3/uL (ref 0–0.1)
Eosinophils Absolute: 0.1 10*3/uL (ref 0–0.7)
Eosinophils Relative: 2 %
HEMATOCRIT: 24 % — AB (ref 40.0–52.0)
HEMOGLOBIN: 8.4 g/dL — AB (ref 13.0–18.0)
LYMPHS ABS: 2.3 10*3/uL (ref 1.0–3.6)
LYMPHS PCT: 23 %
MCH: 31.7 pg (ref 26.0–34.0)
MCHC: 34.9 g/dL (ref 32.0–36.0)
MCV: 91 fL (ref 80.0–100.0)
MONOS PCT: 6 %
Monocytes Absolute: 0.6 10*3/uL (ref 0.2–1.0)
NEUTROS PCT: 69 %
Neutro Abs: 6.7 10*3/uL — ABNORMAL HIGH (ref 1.4–6.5)
Platelets: 246 10*3/uL (ref 150–440)
RBC: 2.64 MIL/uL — AB (ref 4.40–5.90)
RDW: 13.8 % (ref 11.5–14.5)
WBC: 9.8 10*3/uL (ref 3.8–10.6)

## 2018-07-24 LAB — LACTATE DEHYDROGENASE: LDH: 151 U/L (ref 98–192)

## 2018-07-24 SURGERY — ESOPHAGOGASTRODUODENOSCOPY (EGD) WITH PROPOFOL
Anesthesia: General

## 2018-07-24 MED ORDER — PROPOFOL 500 MG/50ML IV EMUL
INTRAVENOUS | Status: DC | PRN
  Administered 2018-07-24: 50 ug/kg/min via INTRAVENOUS

## 2018-07-24 MED ORDER — LIDOCAINE HCL (PF) 2 % IJ SOLN
INTRAMUSCULAR | Status: DC | PRN
  Administered 2018-07-24: 100 mg

## 2018-07-24 MED ORDER — FENTANYL CITRATE (PF) 100 MCG/2ML IJ SOLN
INTRAMUSCULAR | Status: AC
  Filled 2018-07-24: qty 2

## 2018-07-24 MED ORDER — MIDAZOLAM HCL 2 MG/2ML IJ SOLN
INTRAMUSCULAR | Status: AC
  Filled 2018-07-24: qty 2

## 2018-07-24 MED ORDER — FENTANYL CITRATE (PF) 100 MCG/2ML IJ SOLN
INTRAMUSCULAR | Status: DC | PRN
  Administered 2018-07-24: 50 ug via INTRAVENOUS

## 2018-07-24 MED ORDER — PEG 3350-KCL-NA BICARB-NACL 420 G PO SOLR
4000.0000 mL | Freq: Once | ORAL | Status: DC
  Filled 2018-07-24 (×2): qty 4000

## 2018-07-24 MED ORDER — HALOPERIDOL LACTATE 5 MG/ML IJ SOLN
2.0000 mg | INTRAMUSCULAR | Status: DC | PRN
  Filled 2018-07-24 (×2): qty 0.4

## 2018-07-24 MED ORDER — SODIUM CHLORIDE 0.9 % IV SOLN: INTRAVENOUS | Status: DC

## 2018-07-24 MED ORDER — SODIUM CHLORIDE 0.9 % IV SOLN
INTRAVENOUS | Status: DC
  Administered 2018-07-24: 09:00:00 via INTRAVENOUS

## 2018-07-24 MED ORDER — PROPOFOL 10 MG/ML IV BOLUS
INTRAVENOUS | Status: DC | PRN
  Administered 2018-07-24 (×2): 20 mg via INTRAVENOUS

## 2018-07-24 MED ORDER — LIDOCAINE HCL (PF) 2 % IJ SOLN
INTRAMUSCULAR | Status: AC
  Filled 2018-07-24: qty 10

## 2018-07-24 NOTE — Anesthesia Post-op Follow-up Note (Signed)
Anesthesia QCDR form completed.        

## 2018-07-24 NOTE — Discharge Summary (Signed)
[place] [facility], 77 y.o., DOB [birth year], MRN [id number]. 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 fib Pretension COPD Dementia History of coronary artery disease     Hospital Course Patient is a 77 year old who recently had a strangulated hernia surgery and went for a follow-up with his primary surgeon patient was complaining of blood per rectum therefore had a CBC drawn which was low therefore he was told to come to the ER.  Patient was admitted for GI bleed he underwent endoscopy.  GI was planning to do a colonoscopy tomorrow however patient became agitated and wanted to go home.  Family requested he be discharged.    Patient also needs to be followed up for his low-grade B-cell lymphoma with oncology      Consults  oncology  Significant Tests:  See full reports for all details     Ct Abdomen Pelvis Wo Contrast  Result Date: 07/13/2018 CLINICAL DATA:  77 year old male noted hernia 1 week ago becoming larger over the weekend and painful. Initial encounter. EXAM: CT ABDOMEN AND PELVIS WITHOUT CONTRAST TECHNIQUE: Multidetector CT imaging of the abdomen and pelvis was performed following the standard protocol without IV contrast. COMPARISON:  None. FINDINGS: Lower chest: Basilar subsegmental atelectasis/scarring. Heart size within normal limits. Prominent coronary artery calcifications. Aortic valve calcifications. Trace pericardial fluid. Hepatobiliary: Taking into account limitation by non contrast imaging, no worrisome hepatic lesion. No calcified gallstones. Pancreas: Taking into account limitation by non contrast imaging, no  worrisome pancreatic mass or inflammation. Spleen: Taking into account limitation by non contrast imaging, no worrisome splenic mass which is top-normal in length. Adrenals/Urinary Tract: No obstructing stone or hydronephrosis. Taking into account limitation by non contrast imaging, no worrisome renal mass. Bilateral adrenal low-density structures most suggestive of small adenomas slightly larger on the left measuring up to 2 cm. Dome of bladder extends towards the entrance of the hernia. Bladder decompressed. Stomach/Bowel: Right inguinal canal distal small bowel containing hernia appears markedly inflamed with mixed density surrounding fluid (possible associated hemorrhage) and infiltration of surrounding fat planes. This is causing proximal obstruction. Finding consistent with incarcerated hernia. Cannot determine viability of bowel. No pneumatosis, free air or venous gas currently detected. Fat and vessels extend into this hernia. Prior hernia surgery with clips seen along the medial aspect of the hernia. Dome of bladder extends towards the entrance of the hernia. Vascular/Lymphatic: Atherosclerotic changes aorta and aortic branch vessels. No aortic aneurysm noted. Scattered normal size lymph nodes. Reproductive: Slightly prominent size prostate gland. Other: No free intraperitoneal air. Musculoskeletal: Moderate degenerative changes lower thoracic and lower lumbar spine. Hip joint degenerative changes. Nonspecific osseous lesion left inferior aspect of the L3 vertebra. Smaller nonspecific lesion right ilium. IMPRESSION: 1. Right inguinal canal distal small bowel containing hernia appears markedly inflamed with mixed density surrounding fluid (possible associated hemorrhage) and infiltration of surrounding fat planes. This is causing proximal obstruction. Findings consistent with incarcerated hernia. Cannot determine viability of bowel. No pneumatosis, free air or venous gas currently detected. Fat and vessels  extend into this hernia. Prior hernia surgery with clips seen along the medial aspect of the hernia. Dome of bladder extends towards the entrance of the hernia. 2. Bilateral adrenal  low-density structures most suggestive of small adenomas slightly larger on the left measuring up to 2 cm. 3. Aortic Atherosclerosis (ICD10-I70.0). Atherosclerotic changes aorta, aortic branch vessels, iliac arteries and femoral arteries. No abdominal aortic aneurysm. 4. Prominent coronary artery calcifications. 5. Slightly enlarged prostate gland. 6. Moderate degenerative changes lower thoracic and lower lumbar spine. Hip joint degenerative changes. 7. Nonspecific osseous lesion left inferior aspect of the L3 vertebra. Smaller nonspecific lesion right ilium. These results were called by telephone at the time of interpretation on 07/13/2018 at 11:10 am to Dr. Lavonia Drafts , who verbally acknowledged these results. Electronically Signed   By: Genia Del M.D.   On: 07/13/2018 11:27       Today   Subjective:   Victor Ramos patient denies any complaints  Objective:   Blood pressure (!) 161/88, pulse 97, temperature 97.6 F (36.4 C), temperature source Oral, resp. rate 20, height 5\' 8"  (1.727 m), weight 94.2 kg, SpO2 99 %.  .  Intake/Output Summary (Last 24 hours) at 07/24/2018 1703 Last data filed at 07/24/2018 1327 Gross per 24 hour  Intake 540 ml  Output 1650 ml  Net -1110 ml    Exam VITAL SIGNS: Blood pressure (!) 161/88, pulse 97, temperature 97.6 F (36.4 C), temperature source Oral, resp. rate 20, height 5\' 8"  (1.727 m), weight 94.2 kg, SpO2 99 %.  GENERAL:  77 y.o.-year-old patient lying in the bed with no acute distress.  EYES: Pupils equal, round, reactive to light and accommodation. No scleral icterus. Extraocular muscles intact.  HEENT: Head atraumatic, normocephalic. Oropharynx and nasopharynx clear.  NECK:  Supple, no jugular venous distention. No thyroid enlargement, no tenderness.  LUNGS:  Normal breath sounds bilaterally, no wheezing, rales,rhonchi or crepitation. No use of accessory muscles of respiration.  CARDIOVASCULAR: S1, S2 normal. No murmurs, rubs, or gallops.  ABDOMEN: Soft, nontender, nondistended. Bowel sounds present. No organomegaly or mass.  EXTREMITIES: No pedal edema, cyanosis, or clubbing.  NEUROLOGIC: Cranial nerves II through XII are intact. Muscle strength 5/5 in all extremities. Sensation intact. Gait not checked.  PSYCHIATRIC: The patient is alert and oriented x 3.  SKIN: No obvious rash, lesion, or ulcer.   Data Review     CBC w Diff:  Lab Results  Component Value Date   WBC 9.8 07/24/2018   HGB 8.4 (L) 07/24/2018   HGB 13.4 03/24/2013   HCT 24.0 (L) 07/24/2018   HCT 38.5 (L) 03/24/2013   PLT 246 07/24/2018   PLT 119 (L) 03/24/2013   LYMPHOPCT 23 07/24/2018   LYMPHOPCT 25.5 03/24/2013   MONOPCT 6 07/24/2018   MONOPCT 7.8 03/24/2013   EOSPCT 2 07/24/2018   EOSPCT 1.7 03/24/2013   BASOPCT 0 07/24/2018   BASOPCT 0.5 03/24/2013   CMP:  Lab Results  Component Value Date   NA 143 07/23/2018   NA 142 03/24/2013   K 3.3 (L) 07/23/2018   K 3.7 03/24/2013   CL 109 07/23/2018   CL 108 (H) 03/24/2013   CO2 28 07/23/2018   CO2 29 03/24/2013   BUN 14 07/23/2018   BUN 17 03/24/2013   CREATININE 1.12 07/23/2018   CREATININE 1.23 03/24/2013   PROT 5.8 (L) 07/22/2018   ALBUMIN 3.2 (L) 07/22/2018   BILITOT 0.3 07/22/2018   ALKPHOS 56 07/22/2018   AST 27 07/22/2018   ALT 34 07/22/2018  .  Micro Results No results found for this or any previous visit (from the past 240 hour(s)).      Code Status Orders  (  From admission, onward)         Start     Ordered   07/22/18 2023  Full code  Continuous     07/22/18 2022        Code Status History    Date Active Date Inactive Code Status Order ID Comments User Context   07/13/2018 1225 07/17/2018 2130 Full Code [id number]  Olean Ree, MD ED          Follow-up [place] Follow up on 07/27/2018.   Specialty:  General Practice Why:  cbc check Contact information: [address] [place] [address] [phone]        Jonathon Bellows, MD Follow up in 1 week(s).   Specialty:  Gastroenterology Why:  Talbert Cage information: [place] [address] [phone]           Discharge Medications   Allergies as of 07/24/2018   No Known Allergies     Medication List    TAKE these medications   amLODipine 10 MG tablet Commonly known as:  NORVASC Take 10 mg by mouth daily.   budesonide-formoterol 160-4.5 MCG/ACT inhaler Commonly known as:  SYMBICORT Inhale 2 puffs into the lungs 2 (two) times daily.   finasteride 5 MG tablet Commonly known as:  PROSCAR Take 5 mg by mouth daily.   lisinopril 40 MG tablet Commonly known as:  PRINIVIL,ZESTRIL Take 40 mg by mouth daily.   magnesium 30 MG tablet Take 250 mg by mouth daily.   Melatonin 5 MG Tabs Take 1 tablet by mouth at bedtime.   metoprolol succinate 25 MG 24 hr tablet Commonly known as:  TOPROL-XL Take 25 mg by mouth 2 (two) times daily.   potassium chloride 10 MEQ tablet Commonly known as:  K-DUR,KLOR-CON Take 10 mEq by mouth daily.   pravastatin 20 MG tablet Commonly known as:  PRAVACHOL Take 20 mg by mouth daily.   tamsulosin 0.4 MG Caps capsule Commonly known as:  FLOMAX Take 0.4 mg by mouth daily.   tiotropium 18 MCG inhalation capsule Commonly known as:  SPIRIVA Place 18 mcg into inhaler and inhale daily.          Total Time in preparing paper work, data evaluation and todays exam - 48 minutes  Dustin Flock M.D on 07/24/2018 at [place]  [phone]

## 2018-07-24 NOTE — Progress Notes (Signed)
[facility] Progress Note  Day of Surgery  Subjective: He is awaiting EGD with Dr. Vicente Males this morning. He denied any abdominal pain, nausea, or emesis yesterday. No bowel movements overnight. Tolerated a diet yesterday without difficulty, but has been NPO this morning. + Flatus, mobilizing  Objective: Vital signs in last 24 hours: Temp:  [97.6 F (36.4 C)-99.1 F (37.3 C)] 99.1 F (37.3 C) (09/27 0852) Pulse Rate:  [79-101] 79 (09/27 0852) Resp:  [14-20] 18 (09/27 0852) BP: (124-159)/(67-97) 124/67 (09/27 0852) SpO2:  [97 %-100 %] 99 % (09/27 0852) Last BM Date: 07/22/18  Intake/Output from previous day: 09/26 0701 - 09/27 0700 In: 545.4 [P.O.:240; I.V.:305.4] Out: 950 [Urine:950] Intake/Output this shift: Total I/O In: -  Out: 600 [Urine:600]  PE: Gen:  Alert, NAD, pleasant Pulm:  Normal effort Abd: Soft, non-tender, non-distended. Skin: warm and dry, no rashes, Right inguinal incision is well healed without erythema or drainage  Psych: A&Ox3   Lab Results:  Recent Labs    07/23/18 0519 07/24/18 0529  WBC 11.6* 9.8  HGB 8.7* 8.4*  HCT 25.0* 24.0*  PLT 273 246   BMET Recent Labs    07/22/18 1651 07/23/18 0519  NA 140 143  K 3.3* 3.3*  CL 104 109  CO2 27 28  GLUCOSE 121* 108*  BUN 18 14  CREATININE 1.18 1.12  CALCIUM 8.9 8.5*   PT/INR No results for input(s): LABPROT, INR in the last 72 hours. CMP     Component Value Date/Time   NA 143 07/23/2018 0519   NA 142 03/24/2013 0403   K 3.3 (L) 07/23/2018 0519   K 3.7 03/24/2013 0403   CL 109 07/23/2018 0519   CL 108 (H) 03/24/2013 0403   CO2 28 07/23/2018 0519   CO2 29 03/24/2013 0403   GLUCOSE 108 (H) 07/23/2018 0519   GLUCOSE 104 (H) 03/24/2013 0403   BUN 14 07/23/2018 0519   BUN 17 03/24/2013 0403   CREATININE 1.12 07/23/2018 0519   CREATININE 1.23 03/24/2013 0403   CALCIUM 8.5 (L) 07/23/2018 0519   CALCIUM 7.8 (L) 03/24/2013 0403   PROT 5.8 (L) 07/22/2018 1651   ALBUMIN 3.2  (L) 07/22/2018 1651   AST 27 07/22/2018 1651   ALT 34 07/22/2018 1651   ALKPHOS 56 07/22/2018 1651   BILITOT 0.3 07/22/2018 1651   GFRNONAA >60 07/23/2018 0519   GFRNONAA 60 (L) 03/24/2013 0403   GFRAA >60 07/23/2018 0519   GFRAA >60 03/24/2013 0403   Lipase  No results found for: LIPASE     Studies/Results: No results found.  Anti-infectives: Anti-infectives (From admission, onward)   None       Assessment/Plan  GI Bleed - POD11 from right inguinal hernia repair and bowel resection, abdominal pain minimal, incision is well healing without evidence of bleeding or infection.  - Awaiting EGD with Dr. Vicente Males this morning, based on the findings of this will consider colonoscopy to evaluate for source of bleed. Appreciate their assistance - Hgb 8.4 this morning from 8.7 yesterday, continue to follow. VSS - Oncology following for low-grade b-cell lymphoma seen on surgical pathology, appreciate there help - Medicine primary, appreciate their help.    LOS: 2 days    Edison Simon , PA-C [facility] 07/24/2018, 10:21 AM [phone] M-F: 7am - 4pm

## 2018-07-24 NOTE — Anesthesia Preprocedure Evaluation (Signed)
Anesthesia Evaluation  Patient identified by MRN, date of birth, ID band Patient awake    Reviewed: Allergy & Precautions, NPO status , Patient's Chart, lab work & pertinent test results, reviewed documented beta blocker date and time   Airway Mallampati: II  TM Distance: >3 FB     Dental  (+) Chipped, Missing   Pulmonary COPD, former smoker,           Cardiovascular hypertension, Pt. on medications and Pt. on home beta blockers + CAD and + Cardiac Stents  + dysrhythmias Atrial Fibrillation      Neuro/Psych PSYCHIATRIC DISORDERS Dementia    GI/Hepatic   Endo/Other    Renal/GU      Musculoskeletal   Abdominal   Peds  Hematology  (+) anemia ,   Anesthesia Other Findings Hb 8.4. K 3.3.  Reproductive/Obstetrics                             Anesthesia Physical Anesthesia Plan  ASA: III  Anesthesia Plan: General   Post-op Pain Management:    Induction: Intravenous  PONV Risk Score and Plan:   Airway Management Planned:   Additional Equipment:   Intra-op Plan:   Post-operative Plan:   Informed Consent: I have reviewed the patients History and Physical, chart, labs and discussed the procedure including the risks, benefits and alternatives for the proposed anesthesia with the patient or authorized representative who has indicated his/her understanding and acceptance.     Plan Discussed with: CRNA  Anesthesia Plan Comments:         Anesthesia Quick Evaluation

## 2018-07-24 NOTE — Progress Notes (Signed)
Pt. was being very confused and wanted to leave. Could not be convinced to stay and could not be re-oriented. Prime doc was notified. Haldol was ordered. Pt wife requested for pt to be discharge since he is only waiting for colonoscopy tomorrow and not to administer haldol.  Prime doc was notified of the request again. Family request was honored and discharge order was received. Pt. wife was instructed to call Dr. Georgeann Oppenheim office tomorrow to discuss the plan of care. Pt wife verbalized understanding.

## 2018-07-24 NOTE — H&P (Signed)
Victor Bellows, MD [address] [place], [place], [place], [address] [id number], [place], [place], [place], [address] Phone: [phone]  Fax: [phone]  Primary Care Physician:  Victor Ramos   Pre-Procedure History & Physical: HPI:  Victor Ramos is a 77 y.o. male is here for an endoscopy    Past Medical History:  Diagnosis Date  . Atrial fibrillation ([place])   . BPH (benign prostatic hyperplasia)   . COPD (chronic obstructive pulmonary disease) ([place])   . Coronary artery disease   . Dementia   . Hypertension   . Lymphoma [facility])     Past Surgical History:  Procedure Laterality Date  . BOWEL RESECTION  07/13/2018   Procedure: SMALL BOWEL RESECTION;  Surgeon: Victor Ree, MD;  Location: [facility] ORS;  Service: General;;  . CORONARY ANGIOPLASTY WITH STENT PLACEMENT    . HERNIA REPAIR    . INGUINAL HERNIA REPAIR Right 07/13/2018   Procedure: HERNIA REPAIR INGUINAL ADULT;  Surgeon: Victor Ree, MD;  Location: [facility] ORS;  Service: General;  Laterality: Right;  . JOINT REPLACEMENT      Prior to Admission medications   Medication Sig Start Date End Date Taking? Authorizing Provider  amLODipine (NORVASC) 10 MG tablet Take 10 mg by mouth daily.   Yes [provider]  budesonide-formoterol (SYMBICORT) 160-4.5 MCG/ACT inhaler Inhale 2 puffs into the lungs 2 (two) times daily.   Yes [provider]  finasteride (PROSCAR) 5 MG tablet Take 5 mg by mouth daily.   Yes [provider]  lisinopril (PRINIVIL,ZESTRIL) 40 MG tablet Take 40 mg by mouth daily.   Yes [provider]  magnesium 30 MG tablet Take 250 mg by mouth daily.    Yes [provider]  Melatonin 5 MG TABS Take 1 tablet by mouth at bedtime.    Yes [provider]  metoprolol succinate (TOPROL-XL) 25 MG 24 hr tablet Take 25 mg by mouth 2 (two) times daily.   Yes [provider]  potassium chloride (K-DUR,KLOR-CON) 10 MEQ tablet Take 10  mEq by mouth daily.    Yes [provider]  pravastatin (PRAVACHOL) 20 MG tablet Take 20 mg by mouth daily.    Yes [provider]  tamsulosin (FLOMAX) 0.4 MG CAPS capsule Take 0.4 mg by mouth daily.    Yes [provider]  tiotropium (SPIRIVA) 18 MCG inhalation capsule Place 18 mcg into inhaler and inhale daily.   Yes [provider]    Allergies as of 07/22/2018  . (No Known Allergies)    Family History  Problem Relation Age of Onset  . Stomach cancer Mother   . Heart attack Father   . Diabetes Father   . Cancer Brother     Social History   Socioeconomic History  . Marital status: Married    Spouse name: Not on file  . Number of children: Not on file  . Years of education: Not on file  . Highest education level: Not on file  Occupational History  . Not on file  Social Needs  . Financial resource strain: Patient refused  . Food insecurity:    Worry: Patient refused    Inability: Patient refused  . Transportation needs:    Medical: Patient refused    Non-medical: Patient refused  Tobacco Use  . Smoking status: Former Research scientist (life sciences)  . Smokeless tobacco: Never Used  Substance and Sexual Activity  . Alcohol use: Yes  Comment: rarely  . Drug use: No  . Sexual activity: Not on file  Lifestyle  . Physical activity:    Days per week: Patient refused    Minutes per session: Patient refused  . Stress: Patient refused  Relationships  . Social connections:    Talks on phone: Patient refused    Gets together: Patient refused    Attends religious service: Patient refused    Active member of club or organization: Patient refused    Attends meetings of clubs or organizations: Patient refused    Relationship status: Patient refused  . Intimate partner violence:    Fear of current or ex partner: Patient refused    Emotionally abused: Patient refused    Physically abused: Patient refused    Forced sexual activity: Patient refused  Other  Topics Concern  . Not on file  Social History Narrative  . Not on file    Review of Systems: See HPI, otherwise negative ROS  Physical Exam: BP 124/67 (BP Location: Left Arm)   Pulse 79   Temp 99.1 F (37.3 C) (Oral)   Resp 18   Ht 5\' 8"  (1.727 m)   Wt 94.2 kg   SpO2 99%   BMI 31.58 kg/m  General:   Alert,  pleasant and cooperative in NAD Head:  Normocephalic and atraumatic. Neck:  Supple; no masses or thyromegaly. Lungs:  Clear throughout to auscultation, normal respiratory effort.    Heart:  +S1, +S2, Regular rate and rhythm, No edema. Abdomen:  Soft, nontender and nondistended. Normal bowel sounds, without guarding, and without rebound.   Neurologic:  Alert and  oriented x4;  grossly normal neurologically.  Impression/Plan: Victor Ramos is here for an endoscopy  to be performed for  evaluation of gi bleed    Risks, benefits, limitations, and alternatives regarding endoscopy have been reviewed with the patient.  Questions have been answered.  All parties agreeable.   Victor Bellows, MD  07/24/2018, 10:18 AM

## 2018-07-24 NOTE — Transfer of Care (Signed)
Immediate Anesthesia Transfer of Care Note  Patient: Victor Ramos  Procedure(s) Performed: ESOPHAGOGASTRODUODENOSCOPY (EGD) WITH PROPOFOL (N/A )  Patient Location: PACU  Anesthesia Type:General  Level of Consciousness: sedated  Airway & Oxygen Therapy: Patient Spontanous Breathing and Patient connected to nasal cannula oxygen  Post-op Assessment: Report given to RN and Post -op Vital signs reviewed and stable  Post vital signs: Reviewed and stable  Last Vitals:  Vitals Value Taken Time  BP 138/91 07/24/2018 10:35 AM  Temp    Pulse 96 07/24/2018 10:35 AM  Resp 9 07/24/2018 10:34 AM  SpO2 100 % 07/24/2018 10:35 AM  Vitals shown include unvalidated device data.  Last Pain:  Vitals:   07/24/18 0852  TempSrc: Oral  PainSc:          Complications: No apparent anesthesia complications

## 2018-07-24 NOTE — Op Note (Signed)
[facility] Gastroenterology Patient Name: Victor Ramos Procedure Date: 07/24/2018 10:19 AM MRN: [id number] Account #: [id number] Date of Birth: Dec 04, 1940 Admit Type: Inpatient Age: 77 Room: [facility] ENDO ROOM 4 Gender: Male Note Status: Finalized Procedure:            Upper GI endoscopy Indications:          Acute post hemorrhagic anemia Providers:            Jonathon Bellows MD, MD Referring MD:         [facility] (Referring MD) Medicines:            Monitored Anesthesia Care Complications:        No immediate complications. Procedure:            Pre-Anesthesia Assessment:                       - Prior to the procedure, a History and Physical was                        performed, and patient medications, allergies and                        sensitivities were reviewed. The patient's tolerance of                        previous anesthesia was reviewed.                       - The risks and benefits of the procedure and the                        sedation options and risks were discussed with the                        patient. All questions were answered and informed                        consent was obtained.                       - ASA Grade Assessment: III - A patient with severe                        systemic disease.                       After obtaining informed consent, the endoscope was                        passed under direct vision. Throughout the procedure,                        the patient's blood pressure, pulse, and oxygen                        saturations were monitored continuously. The Endoscope                        was introduced through the mouth, and advanced to the  third part of duodenum. The upper GI endoscopy was                        accomplished with ease. The patient tolerated the                        procedure well. Findings:      The esophagus was normal.      The stomach was normal.       The examined duodenum was normal. Impression:           - Normal esophagus.                       - Normal stomach.                       - Normal examined duodenum.                       - No specimens collected. Recommendation:       - Return patient to hospital ward for ongoing care.                       - Clear liquid diet for 6 hours. Procedure Code(s):    --- Professional ---                       [phone], Esophagogastroduodenoscopy, flexible, transoral;                        diagnostic, including collection of specimen(s) by                        brushing or washing, when performed (separate procedure) Diagnosis Code(s):    --- Professional ---                       D62, Acute posthemorrhagic anemia CPT copyright 2017 American Medical Association. All rights reserved. The codes documented in this report are preliminary and upon coder review may  be revised to meet current compliance requirements. Jonathon Bellows, MD Jonathon Bellows MD, MD 07/24/2018 10:32:00 AM This report has been signed electronically. Number of Addenda: 0 Note Initiated On: 07/24/2018 10:19 AM      [facility]

## 2018-07-24 NOTE — Progress Notes (Signed)
Hematology/Oncology Progress Note [facility] [phone] [phone]  Patient Care Team: [facility], [place] as PCP - General (General Practice)   Name of the patient: Victor Ramos  [id number]  [birth year]  Date of visit: 07/24/18   INTERVAL HISTORY-  S/p upper endoscopy, normal finding no bleeding source was discovered.  When I entered patient's room, patient has changed to his own cloths and eager to leave hospital. Per family patient does not want to stay in the hospital and is being discharged today. Denies any pain.    Review of systems- Review of Systems  Constitutional: Positive for malaise/fatigue. Negative for chills, fever and weight loss.  HENT: Negative for nosebleeds and sore throat.   Eyes: Negative for double vision, photophobia and redness.  Respiratory: Negative for cough, shortness of breath and wheezing.   Cardiovascular: Negative for chest pain, palpitations and orthopnea.  Gastrointestinal: Negative for abdominal pain, blood in stool, nausea and vomiting.  Genitourinary: Negative for dysuria.  Musculoskeletal: Negative for back pain, myalgias and neck pain.  Skin: Negative for itching and rash.  Neurological: Negative for dizziness, tingling and tremors.  Endo/Heme/Allergies: Negative for environmental allergies. Does not bruise/bleed easily.  Psychiatric/Behavioral: Negative for depression.    No Known Allergies  Patient Active Problem List   Diagnosis Date Noted  . Symptomatic anemia   . CLL (chronic lymphocytic leukemia) ([place])   . Lower GI bleed 07/22/2018  . 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 Atrial fibrillation ([place])   . BPH (benign prostatic hyperplasia)   . COPD (chronic obstructive pulmonary disease) ([place])   . Coronary artery disease   . Dementia   . Hypertension   . Lymphoma [facility])      Past Surgical  History:  Procedure Laterality Date  . BOWEL RESECTION  07/13/2018   Procedure: SMALL BOWEL RESECTION;  Surgeon: Olean Ree, MD;  Location: [facility] ORS;  Service: General;;  . CORONARY ANGIOPLASTY WITH STENT PLACEMENT    . HERNIA REPAIR    . INGUINAL HERNIA REPAIR Right 07/13/2018   Procedure: HERNIA REPAIR INGUINAL ADULT;  Surgeon: Olean Ree, MD;  Location: [facility] ORS;  Service: General;  Laterality: Right;  . JOINT REPLACEMENT      Social History   Socioeconomic History  . Marital status: Married    Spouse name: Not on file  . Number of children: Not on file  . Years of education: Not on file  . Highest education level: Not on file  Occupational History  . Not on file  Social Needs  . Financial resource strain: Patient refused  . Food insecurity:    Worry: Patient refused    Inability: Patient refused  . Transportation needs:    Medical: Patient refused    Non-medical: Patient refused  Tobacco Use  . Smoking status: Former Research scientist (life sciences)  . Smokeless tobacco: Never Used  Substance and Sexual Activity  . Alcohol use: Yes    Comment: rarely  . Drug use: No  . Sexual activity: Not on file  Lifestyle  . Physical activity:    Days per week: Patient refused    Minutes per session: Patient refused  . Stress: Patient refused  Relationships  . Social connections:    Talks on phone: Patient refused    Gets together: Patient refused    Attends religious service: Patient refused    Active member of club or organization: Patient refused  Attends meetings of clubs or organizations: Patient refused    Relationship status: Patient refused  . Intimate partner violence:    Fear of current or ex partner: Patient refused    Emotionally abused: Patient refused    Physically abused: Patient refused    Forced sexual activity: Patient refused  Other Topics Concern  . Not on file  Social History Narrative  . Not on file     Family History  Problem Relation Age of Onset  . Stomach  cancer Mother   . Heart attack Father   . Diabetes Father   . Cancer Brother      Current Facility-Administered Medications:  .  0.9 %  sodium chloride infusion, , Intravenous, Continuous, Jonathon Bellows, MD .  acetaminophen (TYLENOL) tablet 650 mg, 650 mg, Oral, Q6H PRN **OR** acetaminophen (TYLENOL) suppository 650 mg, 650 mg, Rectal, Q6H PRN, Leslye Peer, Richard, MD .  calcium carbonate (TUMS - dosed in mg elemental calcium) chewable tablet 200 mg of elemental calcium, 1 tablet, Oral, BID, Amelia Jo, MD, 200 mg of elemental calcium at 07/23/18 2127 .  finasteride (PROSCAR) tablet 5 mg, 5 mg, Oral, Daily, Leslye Peer, Richard, MD, 5 mg at 07/23/18 1110 .  fluticasone furoate-vilanterol (BREO ELLIPTA) 200-25 MCG/INH 1 puff, 1 puff, Inhalation, Daily, Wieting, Richard, MD, 1 puff at 07/23/18 1110 .  haloperidol lactate (HALDOL) injection 2 mg, 2 mg, Intravenous, Q4H PRN, Dustin Flock, MD .  magnesium oxide (MAG-OX) tablet 400 mg, 400 mg, Oral, Daily, Leslye Peer, Richard, MD, 400 mg at 07/23/18 1110 .  Melatonin TABS 5 mg, 1 tablet, Oral, QHS, Wieting, Richard, MD, 5 mg at 07/23/18 2127 .  metoprolol succinate (TOPROL-XL) 24 hr tablet 25 mg, 25 mg, Oral, BID, Leslye Peer, Richard, MD, 25 mg at 07/24/18 0751 .  ondansetron (ZOFRAN) tablet 4 mg, 4 mg, Oral, Q6H PRN **OR** ondansetron (ZOFRAN) injection 4 mg, 4 mg, Intravenous, Q6H PRN, Leslye Peer, Richard, MD .  pantoprazole (PROTONIX) 80 mg in sodium chloride 0.9 % 250 mL (0.32 mg/mL) infusion, 8 mg/hr, Intravenous, Continuous, Wieting, Richard, MD, Last Rate: 25 mL/hr at 07/24/18 0753, 8 mg/hr at 07/24/18 0753 .  [START ON 07/26/2018] pantoprazole (PROTONIX) injection 40 mg, 40 mg, Intravenous, Q12H, Wieting, Richard, MD .  polyethylene glycol-electrolytes (NuLYTELY/GoLYTELY) solution 4,000 mL, 4,000 mL, Oral, Once, Jonathon Bellows, MD .  potassium chloride SA (K-DUR,KLOR-CON) CR tablet 20 mEq, 20 mEq, Oral, Daily, Dustin Flock, MD .  pravastatin (PRAVACHOL)  tablet 20 mg, 20 mg, Oral, q1800, Loletha Grayer, MD, 20 mg at 07/23/18 1855 .  tamsulosin (FLOMAX) capsule 0.4 mg, 0.4 mg, Oral, Daily, Leslye Peer, Richard, MD, 0.4 mg at 07/23/18 1109 .  tiotropium (SPIRIVA) inhalation capsule 18 mcg, 18 mcg, Inhalation, Daily, Loletha Grayer, MD, 18 mcg at 07/24/18 0752   Physical exam:  Vitals:   07/24/18 1044 07/24/18 1050 07/24/18 1058 07/24/18 1325  BP: (!) 132/96 (!) 141/88 (!) 140/97 (!) 161/88  Pulse: 97 97 100 97  Resp: 19 17 (!) 21 20  Temp:  (!) 97 F (36.1 C)  97.6 F (36.4 C)  TempSrc:    Oral  SpO2: 98% 100% 98% 99%  Weight:      Height:       Physical Exam  Constitutional: No distress.  HENT:  Head: Normocephalic and atraumatic.  Eyes: Pupils are equal, round, and reactive to light. EOM are normal.  Neck: Neck supple.  Cardiovascular: Normal rate.  Pulmonary/Chest: Effort normal. No respiratory distress.  Abdominal: Soft.  Musculoskeletal: Normal range of  motion.  Neurological: He is alert.  Skin: Skin is warm.       CMP Latest Ref Rng & Units 07/23/2018  Glucose 70 - 99 mg/dL 108(H)  BUN 8 - 23 mg/dL 14  Creatinine 0.61 - 1.24 mg/dL 1.12  Sodium 135 - 145 mmol/L 143  Potassium 3.5 - 5.1 mmol/L 3.3(L)  Chloride 98 - 111 mmol/L 109  CO2 22 - 32 mmol/L 28  Calcium 8.9 - 10.3 mg/dL 8.5(L)  Total Protein 6.5 - 8.1 g/dL -  Total Bilirubin 0.3 - 1.2 mg/dL -  Alkaline Phos 38 - 126 U/L -  AST 15 - 41 U/L -  ALT 0 - 44 U/L -   CBC Latest Ref Rng & Units 07/24/2018  WBC 3.8 - 10.6 K/uL 9.8  Hemoglobin 13.0 - 18.0 g/dL 8.4(L)  Hematocrit 40.0 - 52.0 % 24.0(L)  Platelets 150 - 440 K/uL 246  RADIOGRAPHIC STUDIES: I have personally reviewed the radiological images as listed and agreed with the findings in the report. Ct Abdomen Pelvis Wo Contrast  Result Date: 07/13/2018 CLINICAL DATA:  77 year old male noted hernia 1 week ago becoming larger over the weekend and painful. Initial encounter. EXAM: CT ABDOMEN AND PELVIS  WITHOUT CONTRAST TECHNIQUE: Multidetector CT imaging of the abdomen and pelvis was performed following the standard protocol without IV contrast. COMPARISON:  None. FINDINGS: Lower chest: Basilar subsegmental atelectasis/scarring. Heart size within normal limits. Prominent coronary artery calcifications. Aortic valve calcifications. Trace pericardial fluid. Hepatobiliary: Taking into account limitation by non contrast imaging, no worrisome hepatic lesion. No calcified gallstones. Pancreas: Taking into account limitation by non contrast imaging, no worrisome pancreatic mass or inflammation. Spleen: Taking into account limitation by non contrast imaging, no worrisome splenic mass which is top-normal in length. Adrenals/Urinary Tract: No obstructing stone or hydronephrosis. Taking into account limitation by non contrast imaging, no worrisome renal mass. Bilateral adrenal low-density structures most suggestive of small adenomas slightly larger on the left measuring up to 2 cm. Dome of bladder extends towards the entrance of the hernia. Bladder decompressed. Stomach/Bowel: Right inguinal canal distal small bowel containing hernia appears markedly inflamed with mixed density surrounding fluid (possible associated hemorrhage) and infiltration of surrounding fat planes. This is causing proximal obstruction. Finding consistent with incarcerated hernia. Cannot determine viability of bowel. No pneumatosis, free air or venous gas currently detected. Fat and vessels extend into this hernia. Prior hernia surgery with clips seen along the medial aspect of the hernia. Dome of bladder extends towards the entrance of the hernia. Vascular/Lymphatic: Atherosclerotic changes aorta and aortic branch vessels. No aortic aneurysm noted. Scattered normal size lymph nodes. Reproductive: Slightly prominent size prostate gland. Other: No free intraperitoneal air. Musculoskeletal: Moderate degenerative changes lower thoracic and lower lumbar  spine. Hip joint degenerative changes. Nonspecific osseous lesion left inferior aspect of the L3 vertebra. Smaller nonspecific lesion right ilium. IMPRESSION: 1. Right inguinal canal distal small bowel containing hernia appears markedly inflamed with mixed density surrounding fluid (possible associated hemorrhage) and infiltration of surrounding fat planes. This is causing proximal obstruction. Findings consistent with incarcerated hernia. Cannot determine viability of bowel. No pneumatosis, free air or venous gas currently detected. Fat and vessels extend into this hernia. Prior hernia surgery with clips seen along the medial aspect of the hernia. Dome of bladder extends towards the entrance of the hernia. 2. Bilateral adrenal low-density structures most suggestive of small adenomas slightly larger on the left measuring up to 2 cm. 3. Aortic Atherosclerosis (ICD10-I70.0). Atherosclerotic changes aorta, aortic branch  vessels, iliac arteries and femoral arteries. No abdominal aortic aneurysm. 4. Prominent coronary artery calcifications. 5. Slightly enlarged prostate gland. 6. Moderate degenerative changes lower thoracic and lower lumbar spine. Hip joint degenerative changes. 7. Nonspecific osseous lesion left inferior aspect of the L3 vertebra. Smaller nonspecific lesion right ilium. These results were called by telephone at the time of interpretation on 07/13/2018 at 11:10 am to Dr. Lavonia Drafts , who verbally acknowledged these results. Electronically Signed   By: Genia Del M.D.   On: 07/13/2018 11:27    Assessment and plan-  Patient is a 77 y.o. male  with recent history of hernia repair presented to hospital due to bloody bowel movement.  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 #Acute leukemia, likely secondary to GI bleeding. H& H stable. Negative upper endoscopy. I spoke to Dr. Vicente Males, there was a plan for patient to get colonoscopy tomorrow.  Discussed with patient and his  wife and daughter in law.advise patient to stay inpatient and get colonoscopy completed as bleeding source has not been discovered and he is at risk of bleeding again.   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  His case was presented on tumor board 07/23/2018.  On 07/13/2018, prior to the surgery, patient has normal hemoglobin and a platelet counts.  No splenomegaly[size at the high normal limit], asymptomatic.  Recommend watchful waiting. Additional work-up for CLL can be done outpatient after acute issues resolved.   Patient's wife and daughter in law have my card. They know to call cancer center next week to get an appointment.    Earlie Server, MD, PhD Hematology Oncology [facility] at [facility] Pager- [phone] 07/24/2018

## 2018-07-24 NOTE — Anesthesia Postprocedure Evaluation (Signed)
Anesthesia Post Note  Patient: Victor Ramos  Procedure(s) Performed: ESOPHAGOGASTRODUODENOSCOPY (EGD) WITH PROPOFOL (N/A )  Patient location during evaluation: Endoscopy Anesthesia Type: General Level of consciousness: awake and alert Pain management: pain level controlled Vital Signs Assessment: post-procedure vital signs reviewed and stable Respiratory status: spontaneous breathing, nonlabored ventilation, respiratory function stable and patient connected to nasal cannula oxygen Cardiovascular status: blood pressure returned to baseline and stable Postop Assessment: no apparent nausea or vomiting Anesthetic complications: no     Last Vitals:  Vitals:   07/24/18 1050 07/24/18 1058  BP: (!) 141/88 (!) 140/97  Pulse: 97 100  Resp: 17 (!) 21  Temp: (!) 36.1 C   SpO2: 100% 98%    Last Pain:  Vitals:   07/24/18 0852  TempSrc: Oral  PainSc:                  , S

## 2018-07-25 SURGERY — COLONOSCOPY WITH PROPOFOL
Anesthesia: General

## 2018-07-27 ENCOUNTER — Telehealth: Payer: Self-pay

## 2018-07-27 ENCOUNTER — Encounter: Payer: Self-pay | Admitting: Gastroenterology

## 2018-07-27 ENCOUNTER — Telehealth: Payer: Self-pay | Admitting: Surgery

## 2018-07-27 NOTE — Telephone Encounter (Signed)
Flagged on EMMI report for having other questions and not having a follow up scheduled.  Called and spoke with patient's wife.  She mentioned she had questions on when the patient could do another colonoscopy attempt due to complication with the medication taken for it having a negative effect with his dementia, as well as when she can start given him Pradaxa again.  She mentioned she has already spoken with staff at his doctor's office to address.  Reviewed follow up appointments found on AVS to remind of appointment dates/times.   No other questions or concerns at this time.  I thanked her for her time and informed her that they would receive one moe automated call checking on him in the next few days.

## 2018-07-27 NOTE — Telephone Encounter (Signed)
Patient wife wanted to know if he could take the Pradaxa again. Ask Dr. Dahlia Byes and he states they need to ask his PCP and GI doctor.

## 2018-07-27 NOTE — Telephone Encounter (Signed)
Patients wife is calling and just has some questions regarding her husband. Patient's wife is asking if her husband will still need to have a colonoscopy. Please call patient and advise.

## 2018-07-28 ENCOUNTER — Other Ambulatory Visit: Payer: Self-pay

## 2018-07-28 DIAGNOSIS — K922 Gastrointestinal hemorrhage, unspecified: Secondary | ICD-10-CM

## 2018-07-30 ENCOUNTER — Encounter: Payer: Self-pay | Admitting: Oncology

## 2018-07-30 ENCOUNTER — Inpatient Hospital Stay: Payer: Medicare Other | Attending: Oncology

## 2018-07-30 ENCOUNTER — Other Ambulatory Visit: Payer: Self-pay

## 2018-07-30 ENCOUNTER — Inpatient Hospital Stay (HOSPITAL_BASED_OUTPATIENT_CLINIC_OR_DEPARTMENT_OTHER): Payer: Medicare Other | Admitting: Oncology

## 2018-07-30 VITALS — BP 132/90 | HR 74 | Wt 204.9 lb

## 2018-07-30 DIAGNOSIS — Z87891 Personal history of nicotine dependence: Secondary | ICD-10-CM | POA: Insufficient documentation

## 2018-07-30 DIAGNOSIS — D500 Iron deficiency anemia secondary to blood loss (chronic): Secondary | ICD-10-CM

## 2018-07-30 DIAGNOSIS — K922 Gastrointestinal hemorrhage, unspecified: Secondary | ICD-10-CM

## 2018-07-30 DIAGNOSIS — D649 Anemia, unspecified: Secondary | ICD-10-CM

## 2018-07-30 DIAGNOSIS — I482 Chronic atrial fibrillation, unspecified: Secondary | ICD-10-CM

## 2018-07-30 DIAGNOSIS — C9110 Chronic lymphocytic leukemia of B-cell type not having achieved remission: Secondary | ICD-10-CM | POA: Insufficient documentation

## 2018-07-30 DIAGNOSIS — Z79899 Other long term (current) drug therapy: Secondary | ICD-10-CM | POA: Insufficient documentation

## 2018-07-30 DIAGNOSIS — R42 Dizziness and giddiness: Secondary | ICD-10-CM | POA: Diagnosis not present

## 2018-07-30 DIAGNOSIS — I10 Essential (primary) hypertension: Secondary | ICD-10-CM | POA: Insufficient documentation

## 2018-07-30 LAB — CBC WITH DIFFERENTIAL/PLATELET
BASOS PCT: 1 %
Basophils Absolute: 0.1 10*3/uL (ref 0–0.1)
EOS ABS: 0.2 10*3/uL (ref 0–0.7)
EOS PCT: 2 %
HCT: 29 % — ABNORMAL LOW (ref 40.0–52.0)
Hemoglobin: 9.6 g/dL — ABNORMAL LOW (ref 13.0–18.0)
Lymphocytes Relative: 22 %
Lymphs Abs: 2.2 10*3/uL (ref 1.0–3.6)
MCH: 30.1 pg (ref 26.0–34.0)
MCHC: 33 g/dL (ref 32.0–36.0)
MCV: 91.2 fL (ref 80.0–100.0)
MONO ABS: 0.6 10*3/uL (ref 0.2–1.0)
MONOS PCT: 6 %
Neutro Abs: 7 10*3/uL — ABNORMAL HIGH (ref 1.4–6.5)
Neutrophils Relative %: 69 %
Platelets: 322 10*3/uL (ref 150–440)
RBC: 3.18 MIL/uL — ABNORMAL LOW (ref 4.40–5.90)
RDW: 13.9 % (ref 11.5–14.5)
WBC: 10.1 10*3/uL (ref 3.8–10.6)

## 2018-07-30 LAB — SAMPLE TO BLOOD BANK

## 2018-07-30 NOTE — Progress Notes (Signed)
Patient here today as a new patient  

## 2018-07-30 NOTE — Progress Notes (Signed)
Hematology/Oncology progress note [facility] Telephone:([phone] [phone]   Patient Care Team: [facility], [place] as PCP - General (General Practice)  REFERRING PROVIDER: Hospital follow-up CHIEF COMPLAINTS/REASON FOR VISIT:  Evaluation of acute anemia due to GI bleeding and newly diagnosed CLL.  HISTORY OF PRESENTING ILLNESS:  Victor Ramos is a  77 y.o.  male with PMH listed below who was referred to me for evaluation of CLL and anemia. Patient was recently hospitalized due to strangulated hernia, status post hernia repair/bowel resection done by Dr. Hampton Abbot. Patient was on aspirin and Pradaxa for chronic atrial fibrillation.  His anticoagulation and antiplatelet medication were restarted after procedure at discharge. Patient reports having bloody bowel movements since surgery. He had lab work up done and was called to go to ER as his hemoglobin has acutely dropped significantly from baseline 12s to 7s, Patient also felt dizziness.  S/p PRBC transfusion x 1.  Hemoglobin improved appropriately to 8.7.  Hernia sac pathology came back showing hernia sac with low-grade B-cell lymphoma, most consistent with CLL/SLL. Oncology was consulted for evaluation of lymphoma. Wife at bedside.  Patient reports feeling slightly better after blood transfusion.  He was evaluated by gastroenterology and had EGD done during admission.  No bleeding source was identified on EGD.  Patient supposed to have colonoscopy as well, patient declined and was discharged home. Patient presents for follow-up. Accompanied by wife. Reports fatigue has getting better.  Denies any additional episodes of bloody stool or black stool.   Review of Systems  Constitutional: Positive for malaise/fatigue. Negative for chills, fever and weight loss.  HENT: Negative for nosebleeds and sore throat.   Eyes: Negative for double vision, photophobia and redness.  Respiratory: Negative  for cough, shortness of breath and wheezing.   Cardiovascular: Negative for chest pain, palpitations and orthopnea.  Gastrointestinal: Negative for abdominal pain, blood in stool, nausea and vomiting.  Genitourinary: Negative for dysuria.  Musculoskeletal: Negative for back pain, myalgias and neck pain.  Skin: Negative for itching and rash.  Neurological: Negative for dizziness, tingling and tremors.  Endo/Heme/Allergies: Negative for environmental allergies. Does not bruise/bleed easily.  Psychiatric/Behavioral: Negative for depression.    MEDICAL HISTORY:  Past Medical History:  Diagnosis Date  . Atrial fibrillation ([place])   . BPH (benign prostatic hyperplasia)   . COPD (chronic obstructive pulmonary disease) ([place])   . Coronary artery disease   . Dementia ([place])   . Hypertension   . Lymphoma ([place])     SURGICAL HISTORY: Past Surgical History:  Procedure Laterality Date  . BOWEL RESECTION  07/13/2018   Procedure: SMALL BOWEL RESECTION;  Surgeon: Olean Ree, MD;  Location: [facility] ORS;  Service: General;;  . CORONARY ANGIOPLASTY WITH STENT PLACEMENT    . ESOPHAGOGASTRODUODENOSCOPY (EGD) WITH PROPOFOL N/A 07/24/2018   Procedure: ESOPHAGOGASTRODUODENOSCOPY (EGD) WITH PROPOFOL;  Surgeon: Jonathon Bellows, MD;  Location: [facility] ENDOSCOPY;  Service: Gastroenterology;  Laterality: N/A;  . HERNIA REPAIR    . INGUINAL HERNIA REPAIR Right 07/13/2018   Procedure: HERNIA REPAIR INGUINAL ADULT;  Surgeon: Olean Ree, MD;  Location: [facility] ORS;  Service: General;  Laterality: Right;  . JOINT REPLACEMENT      SOCIAL HISTORY: Social History   Socioeconomic History  . Marital status: Married    Spouse name: Not on file  . Number of children: 2  . Years of education: Not on file  . Highest education level: Not on file  Occupational History  . Not on file  Social Needs  .  Financial resource strain: Patient refused  . Food insecurity:    Worry: Patient refused    Inability: Patient refused  .  Transportation needs:    Medical: Patient refused    Non-medical: Patient refused  Tobacco Use  . Smoking status: Former Smoker    Packs/day: 1.50    Years: 40.00    Pack years: 60.00    Types: Cigarettes    Last attempt to quit: 07/30/1996    Years since quitting: 22.0  . Smokeless tobacco: Never Used  Substance and Sexual Activity  . Alcohol use: Yes    Alcohol/week: 4.0 standard drinks    Types: 4 Shots of liquor per week  . Drug use: No  . Sexual activity: Not on file  Lifestyle  . Physical activity:    Days per week: Patient refused    Minutes per session: Patient refused  . Stress: Patient refused  Relationships  . Social connections:    Talks on phone: Patient refused    Gets together: Patient refused    Attends religious service: Patient refused    Active member of club or organization: Patient refused    Attends meetings of clubs or organizations: Patient refused    Relationship status: Patient refused  . Intimate partner violence:    Fear of current or ex partner: Patient refused    Emotionally abused: Patient refused    Physically abused: Patient refused    Forced sexual activity: Patient refused  Other Topics Concern  . Not on file  Social History Narrative  . Not on file    FAMILY HISTORY: Family History  Problem Relation Age of Onset  . Stomach cancer Mother   . Heart attack Father   . Diabetes Father   . Cancer Brother     ALLERGIES:  has No Known Allergies.  MEDICATIONS:  Current Outpatient Medications  Medication Sig Dispense Refill  . amLODipine (NORVASC) 10 MG tablet Take 10 mg by mouth daily.    . budesonide-formoterol (SYMBICORT) 160-4.5 MCG/ACT inhaler Inhale 2 puffs into the lungs 2 (two) times daily.    . finasteride (PROSCAR) 5 MG tablet Take 5 mg by mouth daily.    Marland Kitchen lisinopril (PRINIVIL,ZESTRIL) 40 MG tablet Take 40 mg by mouth daily.    . magnesium 30 MG tablet Take 250 mg by mouth daily.     . Melatonin 5 MG TABS Take 1 tablet  by mouth at bedtime.     . metoprolol succinate (TOPROL-XL) 25 MG 24 hr tablet Take 25 mg by mouth 2 (two) times daily.    . potassium chloride (K-DUR,KLOR-CON) 10 MEQ tablet Take 10 mEq by mouth daily.     . pravastatin (PRAVACHOL) 20 MG tablet Take 20 mg by mouth daily.     . tamsulosin (FLOMAX) 0.4 MG CAPS capsule Take 0.4 mg by mouth daily.     Marland Kitchen tiotropium (SPIRIVA) 18 MCG inhalation capsule Place 18 mcg into inhaler and inhale daily.     No current facility-administered medications for this visit.      PHYSICAL EXAMINATION: ECOG PERFORMANCE STATUS: 1 - Symptomatic but completely ambulatory Vitals:   07/30/18 0949  BP: 132/90  Pulse: 74   Filed Weights   07/30/18 0949  Weight: 204 lb 14.7 oz (92.9 kg)    Physical Exam  Constitutional: He is oriented to person, place, and time. No distress.  HENT:  Head: Normocephalic and atraumatic.  Mouth/Throat: Oropharynx is clear and moist.  Eyes: Pupils are equal, round, and reactive  to light. EOM are normal. No scleral icterus.  Neck: Normal range of motion. Neck supple.  Cardiovascular: Normal rate, regular rhythm and normal heart sounds.  Pulmonary/Chest: Effort normal. No respiratory distress. He has no wheezes.  Abdominal: Soft. Bowel sounds are normal. He exhibits no distension and no mass. There is no tenderness.  Musculoskeletal: Normal range of motion. He exhibits no edema or deformity.  Neurological: He is alert and oriented to person, place, and time. No cranial nerve deficit. Coordination normal.  Skin: Skin is warm and dry. No rash noted. No erythema.  Psychiatric: He has a normal mood and affect.     LABORATORY DATA:  I have reviewed the data as listed Lab Results  Component Value Date   WBC 10.1 07/30/2018   HGB 9.6 (L) 07/30/2018   HCT 29.0 (L) 07/30/2018   MCV 91.2 07/30/2018   PLT 322 07/30/2018   Recent Labs    07/20/18 1537 07/22/18 1651 07/23/18 0519  NA 138 140 143  K 3.3* 3.3* 3.3*  CL 104 104  109  CO2 25 27 28   GLUCOSE 159* 121* 108*  BUN 26* 18 14  CREATININE 1.17 1.18 1.12  CALCIUM 9.0 8.9 8.5*  GFRNONAA 60* 59* >60  GFRAA >60 >60 >60  PROT 5.5* 5.8*  --   ALBUMIN 3.0* 3.2*  --   AST 22 27  --   ALT 33 34  --   ALKPHOS 55 56  --   BILITOT 0.3 0.3  --    Iron/TIBC/Ferritin/ %Sat    Component Value Date/Time   FERRITIN 80 07/22/2018 2336        ASSESSMENT & PLAN:  1. Gastrointestinal hemorrhage, unspecified gastrointestinal hemorrhage type   2. Anemia, unspecified type   3. CLL (chronic lymphocytic leukemia) ([place])    Discussed with patient that anemia secondary to acute blood loss from GI bleed. He does not have additional episodes of bloody or black stool. CBC was checked today and reviewed with patient.  Hemoglobin 9.6, slightly increased compared to the hemoglobin upon discharge. Advised patient to take oral iron supplementation for 2 months.  He may also take Colace as needed for constipation.  #CLL, likely stage 0, no lymphadenopathy, chronic anemia or thrombocytopenia, hepatosplenomegaly. Discussed with patient and recommend watchful waiting.  Repeat CBC, iron TIBC ferritin in 2 months. Orders Placed This Encounter  Procedures  . CBC with Differential/Platelet    Standing Status:   Future    Standing Expiration Date:   07/31/2019  . Iron and TIBC    Standing Status:   Future    Standing Expiration Date:   07/31/2019  . Ferritin    Standing Status:   Future    Standing Expiration Date:   07/31/2019  . Vitamin B12    Standing Status:   Future    Standing Expiration Date:   07/31/2019  . Hold Tube- Blood Bank    Standing Status:   Future    Standing Expiration Date:   07/31/2019    All questions were answered. The patient knows to call the clinic with any problems questions or concerns.  Return of visit: 2 months Thank you for this kind referral and the opportunity to participate in the care of this patient. A copy of today's note is routed to  referring provider  Total face to face encounter time for this patient visit was 30 min. >50% of the time was  spent in counseling and coordination of care.    Talbert Cage  Tasia Catchings, MD, PhD Hematology Oncology [facility] Pager- [phone] 07/30/2018

## 2018-08-23 ENCOUNTER — Encounter: Payer: Self-pay | Admitting: Oncology

## 2018-08-23 DIAGNOSIS — Z7189 Other specified counseling: Secondary | ICD-10-CM | POA: Insufficient documentation

## 2018-08-26 ENCOUNTER — Ambulatory Visit (INDEPENDENT_AMBULATORY_CARE_PROVIDER_SITE_OTHER): Payer: Medicare Other | Admitting: Surgery

## 2018-08-26 ENCOUNTER — Telehealth: Payer: Self-pay | Admitting: *Deleted

## 2018-08-26 ENCOUNTER — Other Ambulatory Visit: Payer: Self-pay

## 2018-08-26 ENCOUNTER — Encounter: Payer: Self-pay | Admitting: Surgery

## 2018-08-26 VITALS — BP 154/96 | HR 109 | Temp 97.7°F | Wt 203.0 lb

## 2018-08-26 DIAGNOSIS — Z09 Encounter for follow-up examination after completed treatment for conditions other than malignant neoplasm: Secondary | ICD-10-CM

## 2018-08-26 DIAGNOSIS — N5089 Other specified disorders of the male genital organs: Secondary | ICD-10-CM

## 2018-08-26 NOTE — Telephone Encounter (Signed)
Message left for patient to call the office.   Patient has been scheduled for a right scrotum ultrasound with doppler for 09-03-18 at [facility] for 11 am (arrive 10:45 am). Prep: none.

## 2018-08-26 NOTE — Patient Instructions (Signed)
We will schedule you the ultrasound. If it shows something that you are needing to see a urologist, then we will let you know.  Please give [place] a call in case you have any questions or concerns.

## 2018-08-26 NOTE — Telephone Encounter (Signed)
Patient's wife called the office back this afternoon stating that the patient does not want to have ultrasound completed here and wants to go to the [place] to have this done. They will contact the [facility] to arrange.   The patient's wife has been notified that Dr. Hampton Abbot will need a copy of the disc and report sent to him for review.   The [facility] Scheduling Department has been notified of cancellation.

## 2018-08-27 ENCOUNTER — Encounter: Payer: Self-pay | Admitting: Surgery

## 2018-08-27 NOTE — Progress Notes (Addendum)
09/08/18  Patient had scrotal ultrasound today.  Results called in to surgeon on call, showing possible right testicular torsion.  Discussed with Dr. Bernardo Heater with Urology.  He has had 4 prior right inguinal hernia repairs prior to mine on 07/13/18.  During the surgery, no cord structures were identified.  He presents with chronic swelling of the right testicle and on exam on 10/30, he had firmness of his right testicle compared to the left.  Ultrasound does not show any flow to the right testicle.  Reviewed with Dr. Bernardo Heater, and we believe this is chronic in nature rather than acute.  Will send a referral to Urology for further evaluation.  Based on findings and history, does not need to go to ED on urgent fashion.  Olean Ree, MD.     08/26/2018  HPI: Victor Ramos is a 77 y.o. male s/p strangulated right inguinal hernia repair on 07/13/2018.  Patient presents for another follow-up appointment as on our last visit he still had some swelling over the right groin and scrotum.  Patient reports that the swelling over the groin has resolved but there still remains swelling and hardness over the right scrotum.  Vital signs: BP (!) 154/96   Pulse (!) 109   Temp 97.7 F (36.5 C) (Skin)   Wt 203 lb (92.1 kg)   SpO2 98% Comment: room air  BMI 30.87 kg/m    Physical Exam: Constitutional: No acute distress Abdomen: Soft, nondistended, nontender to palpation.  Right groin incision is clean dry and intact and has healed well.  There is no induration anymore or swelling in that area.  However the right scrotum does have an area of firmness that covers the testicle as well as the upper portion of the scrotum.  This is not tender and there is no fluctuance  Assessment/Plan: This is a 77 y.o. male s/p strangulated right inguinal hernia repair.  - The right groin swelling has resolved by now and this area is healing well.  However the right scrotum does have an area of firmness and appears larger  than the left side.  Given the amount of inflammation and scarring in the right groin from his prior surgeries with prior hernia repairs, my concern would be of any potential injury to the right testicle.  We will order an ultrasound of the right scrotum with Doppler in order to evaluate the testicle in the area of firmness.  If there is any issues of concern, will inform the patient refer him to urology for further management.  Otherwise he may follow-up with [place] on an as-needed basis.   Melvyn Neth, [facility]

## 2018-08-31 ENCOUNTER — Telehealth: Payer: Self-pay | Admitting: *Deleted

## 2018-08-31 NOTE — Telephone Encounter (Signed)
Patient's wife contacted the office today and states that patient does not want to go to the [place] to have his ultrasound done and has decided to have this at [facility] after all.   The patient's wife called and got ultrasound rescheduled for 09-08-18.

## 2018-09-03 ENCOUNTER — Ambulatory Visit: Payer: Medicare Other

## 2018-09-08 ENCOUNTER — Other Ambulatory Visit: Payer: Self-pay

## 2018-09-08 ENCOUNTER — Telehealth: Payer: Self-pay

## 2018-09-08 ENCOUNTER — Ambulatory Visit
Admission: RE | Admit: 2018-09-08 | Discharge: 2018-09-08 | Disposition: A | Discharge status: Home / Self Care | Payer: Medicare Other | Source: Ambulatory Visit | Attending: Surgery | Admitting: Surgery

## 2018-09-08 DIAGNOSIS — N209 Urinary calculus, unspecified: Secondary | ICD-10-CM | POA: Diagnosis not present

## 2018-09-08 DIAGNOSIS — N5089 Other specified disorders of the male genital organs: Secondary | ICD-10-CM | POA: Diagnosis not present

## 2018-09-08 DIAGNOSIS — N4400 Torsion of testis, unspecified: Secondary | ICD-10-CM

## 2018-09-08 NOTE — Telephone Encounter (Signed)
Urgent referral sent to [facility] per Dr.Piscoya per radiology.   Left message for patient to call office regarding the above and to also let him know the results of ultrasound.

## 2018-09-09 ENCOUNTER — Telehealth: Payer: Self-pay

## 2018-09-09 ENCOUNTER — Telehealth: Payer: Self-pay | Admitting: *Deleted

## 2018-09-09 NOTE — Telephone Encounter (Signed)
Radiologist called and states ultrasound showed testicle torsed. Call Shelia in [place] to let Dr. Hampton Abbot know. Adela Lank stated she would give him the message.

## 2018-09-09 NOTE — Telephone Encounter (Signed)
Jessica called sheila in [place] clinic yesterday to notify DR.Piscoya of abnormal  Ultrasound results on this patient.   Per Dr.Piscoya he and Dr.Stoiff spoke via phone regarding results and I was told to send urgent referral to [facility] per Dr.Stoiff and [place] phone call. Dr.Piscoya  was told that the patient would not need to go to the emergency room however they would contact patient to be seen upon receiving urgent referral.  I spoke with patient's wife today to let her know [facility] will be calling to make an appointment for her husband to be seen.

## 2018-09-09 NOTE — Telephone Encounter (Signed)
Spoke with patient's wife and let her know to be expecting  a call from [facility] urological due to findings on Ultrasound.

## 2018-09-09 NOTE — Telephone Encounter (Signed)
Attempted to reach pt regarding urgent referral from [facility]  received today for testicular torsion.

## 2018-10-01 ENCOUNTER — Other Ambulatory Visit: Payer: Medicare Other

## 2018-10-01 ENCOUNTER — Ambulatory Visit: Payer: Medicare Other | Admitting: Oncology

## 2018-10-09 ENCOUNTER — Ambulatory Visit: Payer: Self-pay | Admitting: Urology

## 2020-03-21 ENCOUNTER — Ambulatory Visit: Payer: Medicare Other | Admitting: Podiatry

## 2020-03-28 ENCOUNTER — Encounter: Payer: Self-pay | Admitting: Podiatry

## 2020-03-28 ENCOUNTER — Ambulatory Visit: Payer: Medicare PPO | Admitting: Podiatry

## 2020-03-28 ENCOUNTER — Other Ambulatory Visit: Payer: Self-pay

## 2020-03-28 DIAGNOSIS — M25569 Pain in unspecified knee: Secondary | ICD-10-CM | POA: Insufficient documentation

## 2020-03-28 DIAGNOSIS — L03031 Cellulitis of right toe: Secondary | ICD-10-CM | POA: Diagnosis not present

## 2020-03-28 DIAGNOSIS — L989 Disorder of the skin and subcutaneous tissue, unspecified: Secondary | ICD-10-CM | POA: Diagnosis not present

## 2020-03-28 DIAGNOSIS — M179 Osteoarthritis of knee, unspecified: Secondary | ICD-10-CM | POA: Insufficient documentation

## 2020-03-28 DIAGNOSIS — M109 Gout, unspecified: Secondary | ICD-10-CM | POA: Insufficient documentation

## 2020-03-28 DIAGNOSIS — M1991 Primary osteoarthritis, unspecified site: Secondary | ICD-10-CM | POA: Insufficient documentation

## 2020-03-28 DIAGNOSIS — M79674 Pain in right toe(s): Secondary | ICD-10-CM | POA: Diagnosis not present

## 2020-03-28 DIAGNOSIS — M79675 Pain in left toe(s): Secondary | ICD-10-CM

## 2020-03-28 DIAGNOSIS — B351 Tinea unguium: Secondary | ICD-10-CM

## 2020-03-28 MED ORDER — GENTAMICIN SULFATE 0.1 % EX CREA: 1.0000 "application " | TOPICAL_CREAM | Freq: Two times a day (BID) | CUTANEOUS | 1 refills | Status: DC

## 2020-03-28 NOTE — Progress Notes (Signed)
    Subjective: Patient is a 79 y.o. male presenting to the office today as a new patient for chief complaint of painful callus lesion(s) noted to the bilateral feet that has been present for several years Patient also complains of elongated, thickened nails that cause pain while ambulating in shoes.  He is unable to trim his own nails.  The patient's wife used to trim his nails but can no longer trim his nails for him.  Patient presents today for further treatment and evaluation.  Past Medical History:  Diagnosis Date  . Atrial fibrillation ([place])   . BPH (benign prostatic hyperplasia)   . COPD (chronic obstructive pulmonary disease) ([place])   . Coronary artery disease   . Dementia ([place])   . Hypertension   . Lymphoma ([place])     Objective:  Physical Exam General: Alert and oriented x3 in no acute distress  Dermatology: Hyperkeratotic lesion(s) present on the bilateral toes x4. Pain on palpation with a central nucleated core noted. Skin is warm, dry and supple bilateral lower extremities. Negative for open lesions or macerations. Nails are tender, long, thickened and dystrophic with subungual debris, consistent with onychomycosis, 1-5 bilateral. No signs of infection noted. There is some purulent drainage after debridement of the right nail plate consistent with a purulent subungual abscess.  There is also increased tenderness associated to the right nail plate.  The nail plate is partially detached to the central portion of the nail where the subungual abscesses.  No malodor noted.  No proximal erythema or edema proximal to the nail plate  Vascular: Palpable pedal pulses bilaterally. No edema or erythema noted. Capillary refill within normal limits.  Neurological: Epicritic and protective threshold grossly intact bilaterally.   Musculoskeletal Exam: Pain on palpation at the keratotic lesion(s) noted. Range of motion within normal limits bilateral. Muscle strength 5/5 in all groups  bilateral.  Assessment: 1. Onychodystrophic nails 1-5 bilateral with hyperkeratosis of nails.  2. Onychomycosis of nail due to dermatophyte bilateral 3.  Preulcerative callus lesions to the bilateral feet x4 4.  Subungual abscess right hallux nail plate   Plan of Care:  1. Patient evaluated. 2. Excisional debridement of keratoic lesion(s) using a chisel blade was performed without incident.  3. Dressed with light dressing. 4. Mechanical debridement of nails 1-5 bilaterally performed using a nail nipper. Filed with dremel without incident.  5.  After discussing the subungual abscess to the patient we determined is in his best interest to temporarily remove the nail plate.  This will allow for drainage.  The patient and spouse both agree.  The toe was prepped in aseptic manner and digital block was performed consisting of 3 mL of 2% lidocaine plain.  Nail was avulsed completely and dry sterile dressing was applied  6.  Prescription for gentamicin cream  7.  Patient is to return to the clinic in 3 months for routine nail care.   [name], [name] [facility]  Dr. [name], [place]                                        [place],  [address]                Office [phone]  Fax [phone]

## 2020-07-06 ENCOUNTER — Encounter: Payer: Self-pay | Admitting: Podiatry

## 2020-07-06 ENCOUNTER — Ambulatory Visit: Payer: Medicare PPO | Admitting: Podiatry

## 2020-07-06 ENCOUNTER — Other Ambulatory Visit: Payer: Self-pay

## 2020-07-06 DIAGNOSIS — M79674 Pain in right toe(s): Secondary | ICD-10-CM

## 2020-07-06 DIAGNOSIS — M79675 Pain in left toe(s): Secondary | ICD-10-CM | POA: Diagnosis not present

## 2020-07-06 DIAGNOSIS — B351 Tinea unguium: Secondary | ICD-10-CM

## 2020-07-06 DIAGNOSIS — D689 Coagulation defect, unspecified: Secondary | ICD-10-CM | POA: Insufficient documentation

## 2020-07-06 NOTE — Progress Notes (Signed)
This patient returns to my office for at risk foot care.  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 This patient is unable to cut nails himself since the patient cannot reach his nails.These nails are painful walking and wearing shoes.  This patient presents for at risk foot care today.  General Appearance  Alert, conversant and in no acute stress.  Vascular  Dorsalis pedis and posterior tibial  pulses are palpable  bilaterally.  Capillary return is within normal limits  bilaterally. Temperature is within normal limits  bilaterally.  Neurologic  Senn-Weinstein monofilament wire test within normal limits  bilaterally. Muscle power within normal limits bilaterally.  Nails Thick disfigured discolored nails with subungual debris  Hallux nails bilateral.. No evidence of bacterial infection or drainage bilaterally.  Orthopedic  No limitations of motion  feet .  No crepitus or effusions noted.  No bony pathology or digital deformities noted.  Skin  normotropic skin with no porokeratosis noted bilaterally.  No signs of infections or ulcers noted.     Onychomycosis  Pain in right toes  Pain in left toes  Consent was obtained for treatment procedures.   Mechanical debridement of nails 1-5  bilaterally performed with a nail nipper.  Filed with dremel without incident.    Return office visit    3 months                  Told patient to return for periodic foot care and evaluation due to potential at risk complications.   Gardiner Barefoot [name]

## 2020-10-05 ENCOUNTER — Ambulatory Visit: Payer: Medicare PPO | Admitting: Podiatry

## 2020-10-05 ENCOUNTER — Encounter: Payer: Self-pay | Admitting: Podiatry

## 2020-10-05 ENCOUNTER — Other Ambulatory Visit: Payer: Self-pay

## 2020-10-05 DIAGNOSIS — B351 Tinea unguium: Secondary | ICD-10-CM | POA: Diagnosis not present

## 2020-10-05 DIAGNOSIS — M79674 Pain in right toe(s): Secondary | ICD-10-CM

## 2020-10-05 DIAGNOSIS — D689 Coagulation defect, unspecified: Secondary | ICD-10-CM

## 2020-10-05 DIAGNOSIS — M79675 Pain in left toe(s): Secondary | ICD-10-CM

## 2020-10-05 NOTE — Progress Notes (Signed)
This patient returns to my office for at risk foot care.  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 This patient is unable to cut nails himself since the patient cannot reach his nails.These nails are painful walking and wearing shoes.  This patient presents for at risk foot care today.  General Appearance  Alert, conversant and in no acute stress.  Vascular  Dorsalis pedis and posterior tibial  pulses are weakly  palpable  bilaterally.  Capillary return is within normal limits  bilaterally. Temperature is within normal limits  Bilaterally. Absent digital hair.  Neurologic  Senn-Weinstein monofilament wire test within normal limits  bilaterally. Muscle power within normal limits bilaterally.  Nails Thick disfigured discolored nails with subungual debris  Hallux nails bilateral.. No evidence of bacterial infection or drainage bilaterally.  Orthopedic  No limitations of motion  feet .  No crepitus or effusions noted.  No bony pathology or digital deformities noted.  Skin  normotropic skin with no porokeratosis noted bilaterally.  No signs of infections or ulcers noted.     Onychomycosis  Pain in right toes  Pain in left toes  Consent was obtained for treatment procedures.   Mechanical debridement of nails 1-5  bilaterally performed with a nail nipper.  Filed with dremel without incident.    Return office visit    3 months                  Told patient to return for periodic foot care and evaluation due to potential at risk complications.   Gardiner Barefoot [name]

## 2020-10-22 IMAGING — US US SCROTUM W/ DOPPLER COMPLETE
1 series · 13 of 25 positions shown · non-contrast
Comparison: None.

Addendum:
CLINICAL DATA: Scrotal swelling on the right

EXAM:
SCROTAL ULTRASOUND
DOPPLER ULTRASOUND OF THE TESTICLES
TECHNIQUE: Complete ultrasound examination of the testicles, epididymis, and
other scrotal structures was performed. Color and spectral Doppler
ultrasound were also utilized to evaluate blood flow to the
testicles.

[Series 1: us scrotum w/ doppler complete · 0.07mm/px · 13 of 62 slices shown]
[im 1/62]
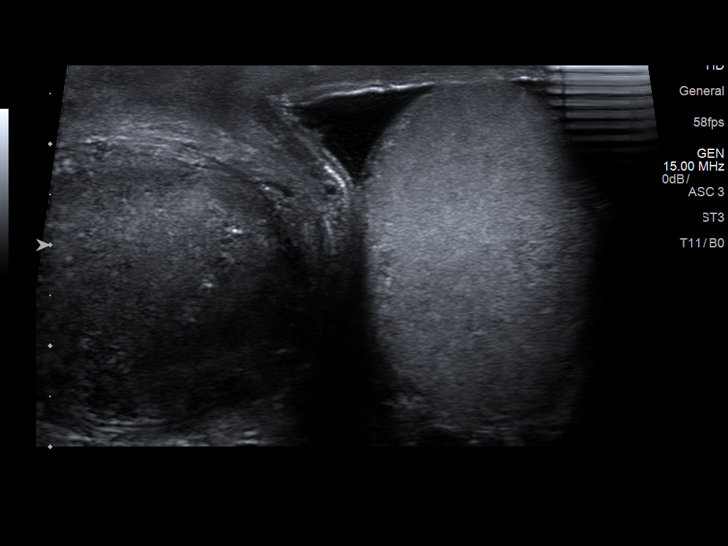
[im 6/62]
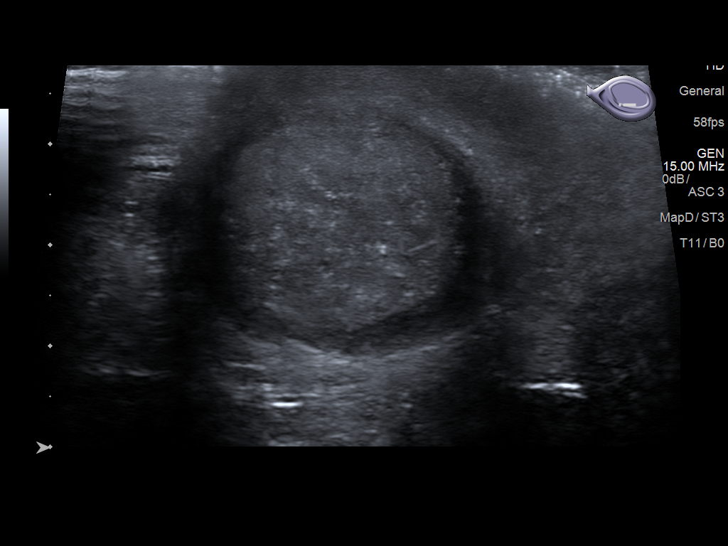
[im 11/62]
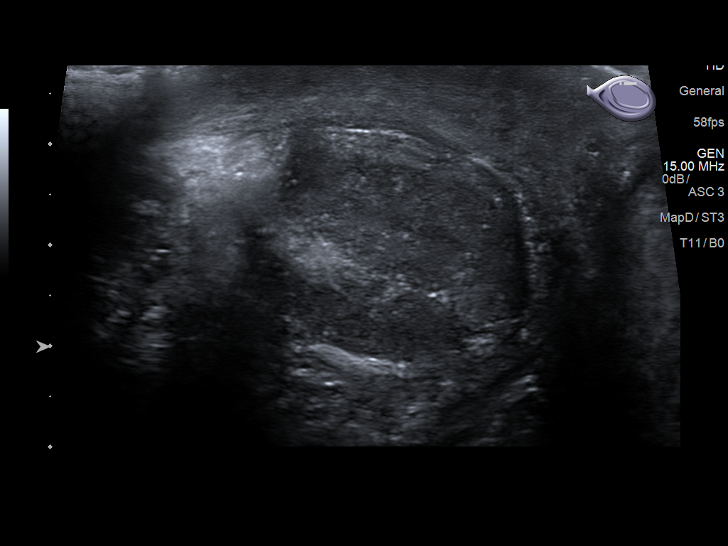
[im 16/62]
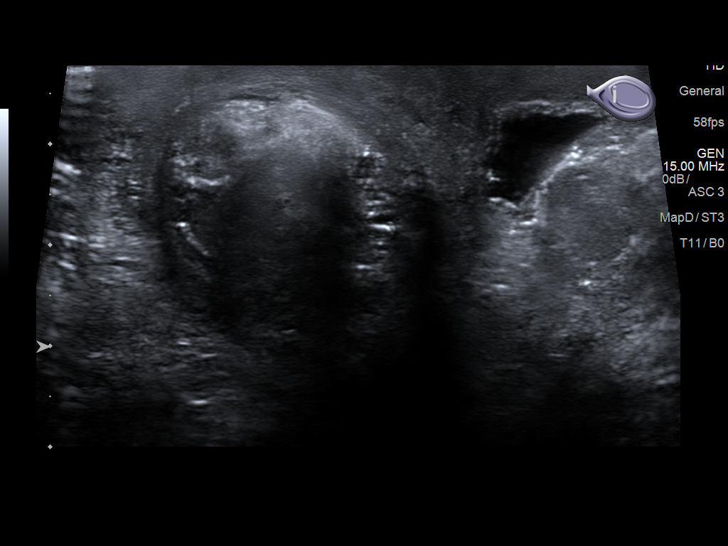
[im 21/62]
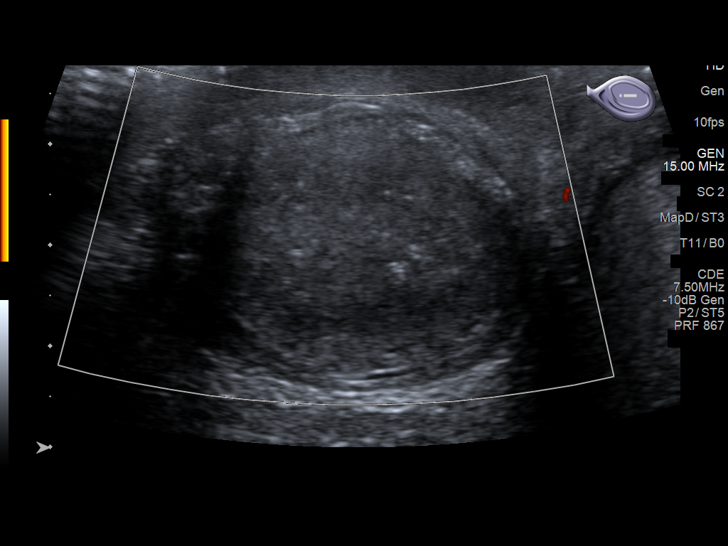
[im 26/62]
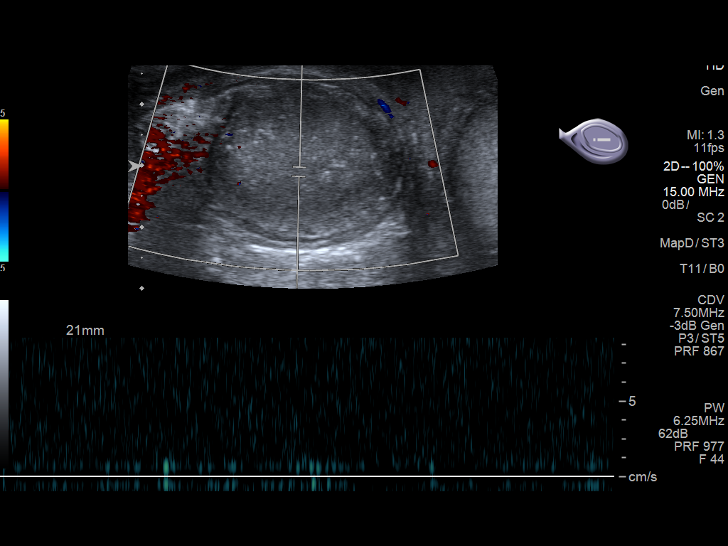
[im 31/62]
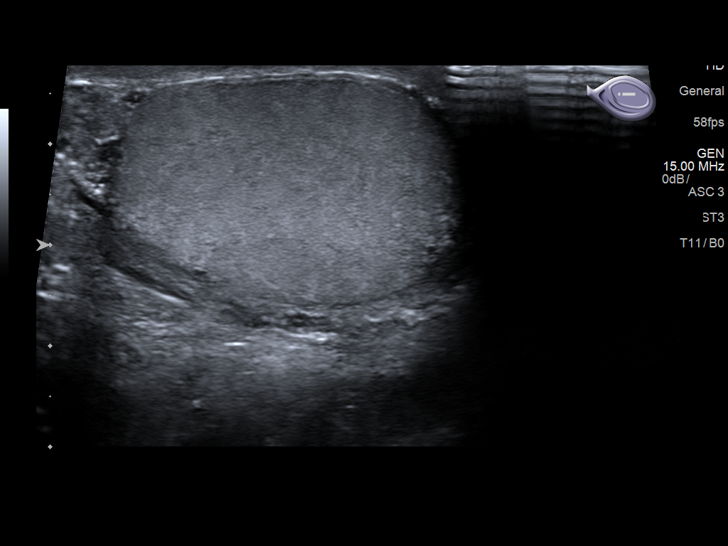
[im 36/62]
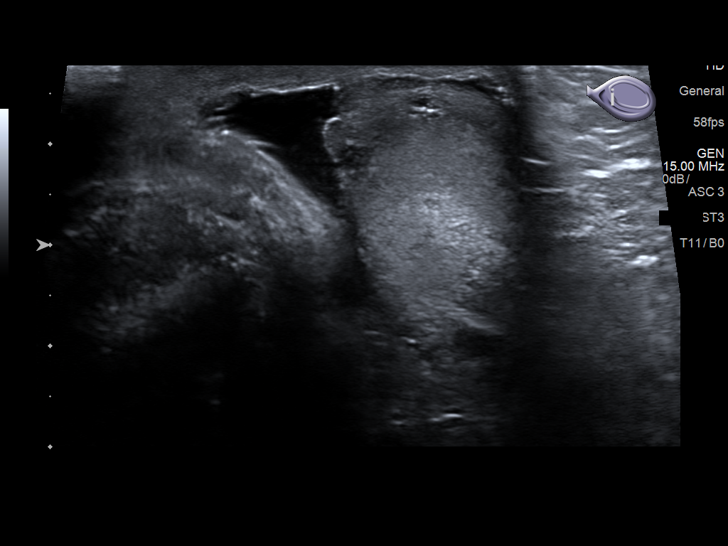
[im 41/62]
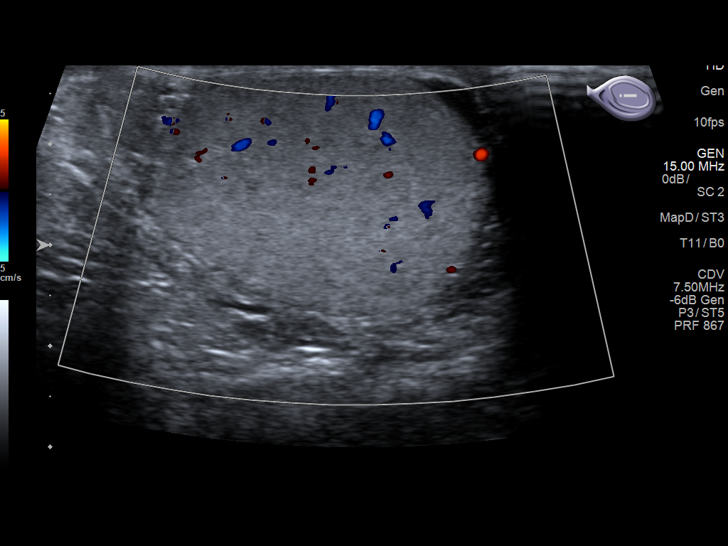
[im 46/62]
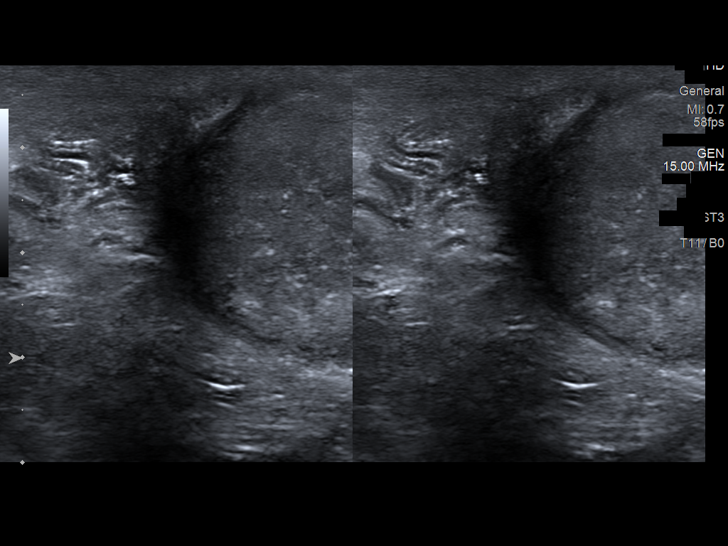
[im 51/62]
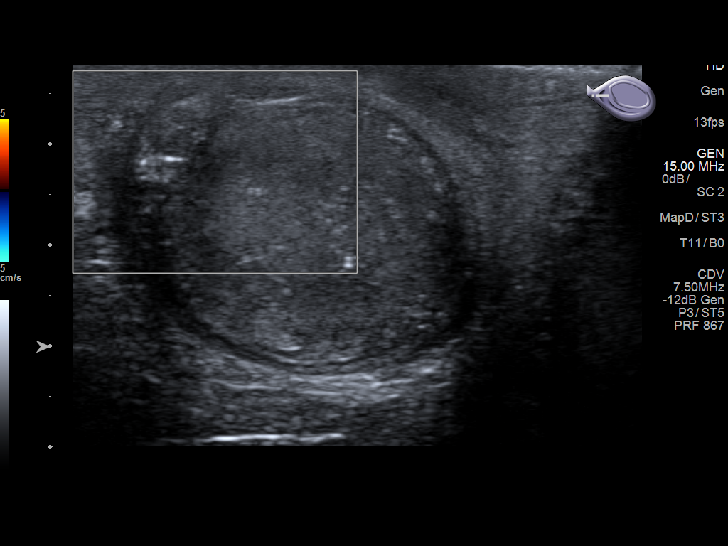
[im 56/62]
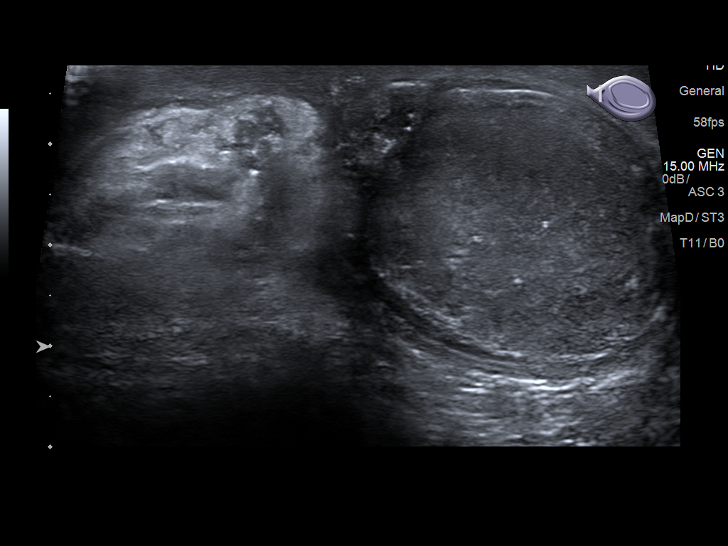
[im 62/62]
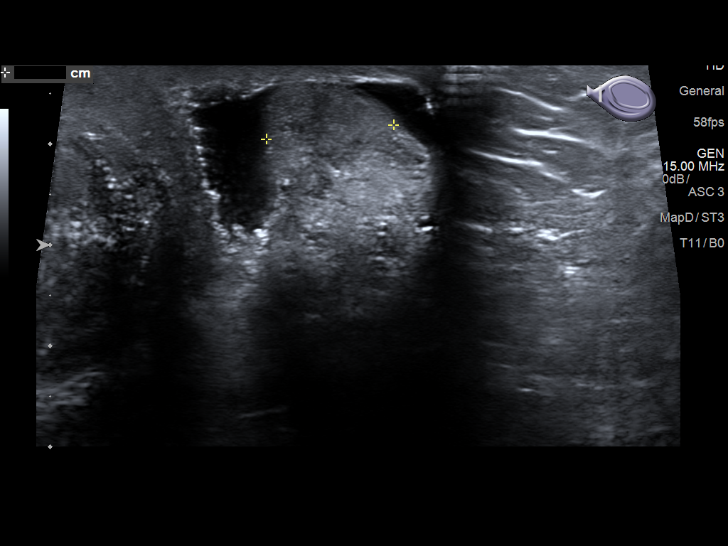

[13 of 25 positions shown; findings below may reference images not displayed]

FINDINGS: Right testicle

Measurements: 3.1 x 2.6 x 2.8 cm. Diffusely hypoechoic with
scattered microlithiasis. No definite intratesticular mass.

Left testicle

Measurements: 3.5 x 2.6 x 2.6 cm. No mass or microlithiasis
visualized.

Right epididymis:  No focal mass.

Left epididymis:  Normal in size and appearance.

Hydrocele:  None visualized.

Varicocele:  None visualized.

Pulsed Doppler interrogation of both testes demonstrates no
demonstrable arterial and venous flow within the right testicle and
epididymis concerning for right-sided testicular and epididymal
torsion. Normal low resistance arterial and venous waveforms are
noted of the left testicle and epididymis.
IMPRESSION: 1. Abnormal hypoechoic appearance of the right testicle with no
demonstrable arterial nor venous flow noted within consistent with
testicular torsion. Scattered testicular microlithiasis is
identified of the right testicle.
2. Unremarkable left testicle and epididymis.

ADDENDUM:
These results were called by telephone at the time of interpretation
on 09/08/2018 at [DATE] to covering physician Dr. Chai , who
verbally acknowledged these results.

*** End of Addendum ***

## 2020-11-30 ENCOUNTER — Emergency Department: Payer: Medicare PPO

## 2020-11-30 ENCOUNTER — Encounter: Payer: Self-pay | Admitting: Emergency Medicine

## 2020-11-30 ENCOUNTER — Other Ambulatory Visit: Payer: Self-pay

## 2020-11-30 ENCOUNTER — Emergency Department
Admission: EM | Admit: 2020-11-30 | Discharge: 2020-11-30 | Disposition: A | Discharge status: Home / Self Care | Payer: Medicare PPO | Attending: Emergency Medicine | Admitting: Emergency Medicine

## 2020-11-30 DIAGNOSIS — Z96659 Presence of unspecified artificial knee joint: Secondary | ICD-10-CM | POA: Insufficient documentation

## 2020-11-30 DIAGNOSIS — M25512 Pain in left shoulder: Secondary | ICD-10-CM | POA: Diagnosis not present

## 2020-11-30 DIAGNOSIS — Z7951 Long term (current) use of inhaled steroids: Secondary | ICD-10-CM | POA: Diagnosis not present

## 2020-11-30 DIAGNOSIS — W19XXXA Unspecified fall, initial encounter: Secondary | ICD-10-CM

## 2020-11-30 DIAGNOSIS — I10 Essential (primary) hypertension: Secondary | ICD-10-CM | POA: Diagnosis not present

## 2020-11-30 DIAGNOSIS — F0390 Unspecified dementia without behavioral disturbance: Secondary | ICD-10-CM | POA: Diagnosis not present

## 2020-11-30 DIAGNOSIS — J449 Chronic obstructive pulmonary disease, unspecified: Secondary | ICD-10-CM | POA: Insufficient documentation

## 2020-11-30 DIAGNOSIS — I2510 Atherosclerotic heart disease of native coronary artery without angina pectoris: Secondary | ICD-10-CM | POA: Insufficient documentation

## 2020-11-30 DIAGNOSIS — S0990XA Unspecified injury of head, initial encounter: Secondary | ICD-10-CM | POA: Diagnosis present

## 2020-11-30 DIAGNOSIS — I609 Nontraumatic subarachnoid hemorrhage, unspecified: Secondary | ICD-10-CM

## 2020-11-30 DIAGNOSIS — Z79899 Other long term (current) drug therapy: Secondary | ICD-10-CM | POA: Insufficient documentation

## 2020-11-30 DIAGNOSIS — W01198A Fall on same level from slipping, tripping and stumbling with subsequent striking against other object, initial encounter: Secondary | ICD-10-CM | POA: Insufficient documentation

## 2020-11-30 DIAGNOSIS — Z7901 Long term (current) use of anticoagulants: Secondary | ICD-10-CM

## 2020-11-30 DIAGNOSIS — Z87891 Personal history of nicotine dependence: Secondary | ICD-10-CM | POA: Diagnosis not present

## 2020-11-30 DIAGNOSIS — Z7982 Long term (current) use of aspirin: Secondary | ICD-10-CM | POA: Diagnosis not present

## 2020-11-30 DIAGNOSIS — Z951 Presence of aortocoronary bypass graft: Secondary | ICD-10-CM | POA: Diagnosis not present

## 2020-11-30 DIAGNOSIS — S066X0A Traumatic subarachnoid hemorrhage without loss of consciousness, initial encounter: Secondary | ICD-10-CM | POA: Insufficient documentation

## 2020-11-30 MED ORDER — ACETAMINOPHEN 500 MG PO TABS
1000.0000 mg | ORAL_TABLET | Freq: Once | ORAL | Status: AC
  Administered 2020-11-30: 1000 mg via ORAL
  Filled 2020-11-30: qty 2

## 2020-11-30 NOTE — ED Notes (Signed)
Pt's wife to give pt's home meds per [facility] from [facility].

## 2020-11-30 NOTE — ED Notes (Addendum)
See triage note, pt wife wife pt hx dementia. Reports fall today hitting head and left shoulder. Takes eliquis Pt denies pain at this time. Alert, oriented to person and place only.  Pt declined to move to stretcher at from w/c at this time

## 2020-11-30 NOTE — ED Notes (Signed)
Pt provided sandwich tray 

## 2020-11-30 NOTE — ED Triage Notes (Signed)
Pt comes into the ED via POV c/o fall this morning.  Pt states he was trying to put his shoes on and lost his balance.  Pt did hit the left side of his forehead and the patient is currently on Eliquis.  Pt also c/o left shoulder and arm pain.  Pt in NAD at this time with even and unlabored respirations.  Unknown if the patient had any LOC.

## 2020-11-30 NOTE — Discharge Instructions (Signed)
As we discussed, Victor Ramos has a very small area of bleeding around his brain inside of his skull.  Due to this, please hold his Eliquis for the next 1 week.  You may provide the rest of his normal prescription medications, and then resume the Eliquis after 1 week's time.  If he develops any further worsening dizziness, headaches, falls please return to the ED. Otherwise, follow-up with his PCP in the next 1-2 weeks.  Use Tylenol for pain and fevers.  Up to 1000 mg per dose, up to 4 times per day.  Do not take more than 4000 mg of Tylenol/acetaminophen within 24 hours.Victor Ramos of his left shoulder showed no fracture or dislocation.

## 2020-11-30 NOTE — ED Provider Notes (Signed)
[facility] Emergency Department Provider Note ____________________________________________   Event Date/Time   First MD Initiated Contact with Patient 11/30/20 1802     (approximate)  I have reviewed the triage vital signs and the nursing notes.  HISTORY  Chief Complaint Fall   HPI Victor Ramos is a 80 y.o. Victor Ramos presents to the ED for evaluation of a fall.   Chart review indicates history of A. fib on Eliquis.  Dementia.  Patient presents from home with his wife, who provides additional history, for evaluation after a fall.  They report that he tried to put his shoes on this morning, lost his balance and fell forward striking his left-sided forehead and shoulder.  Unknown LOC.  Wife reports that he "seemed a little more dizzy than normal" and she was concerned about his left shoulder pain, so they present to the ED for evaluation.  Here in the ED, patient is reporting mild, 3/10 intensity aching left shoulder pain that is nonradiating.  Wife reports that he is acting normally.  Past Medical History:  Diagnosis Date  . Atrial fibrillation ([place])   . BPH (benign prostatic hyperplasia)   . COPD (chronic obstructive pulmonary disease) ([place])   . Coronary artery disease   . Dementia ([place])   . Hypertension   . Lymphoma [facility])     Patient Active Problem List   Diagnosis Date Noted  . 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 Gout attack 03/28/2020  . Knee pain 03/28/2020  . Localized, primary osteoarthritis 03/28/2020  . Osteoarthritis of knee 03/28/2020  . Goals of care, counseling/discussion 08/23/2018  . Symptomatic anemia   . CLL (chronic lymphocytic leukemia) ([place])   . Lower GI bleed 07/22/2018  . Recurrent inguinal hernia of right side with obstruction 07/13/2018  . CAD (coronary artery disease) 04/13/2015  . Chronic a-fib ([place]) 04/13/2015  . Acute gouty arthropathy 05/11/2014  . Arthropathy 05/11/2014  . Benign essential HTN 05/11/2014   . Cardiac dysrhythmia 05/11/2014  . Dysuria 05/19/2013  . Gross hematuria 04/14/2013  . Cardiovascular disease 10/13/2012  . Hyperlipidemia 10/13/2012  . Benign localized prostatic hyperplasia with lower urinary tract symptoms (LUTS) 08/03/2012  . 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 Elevated prostate specific antigen (PSA) 08/03/2012  . Incomplete emptying of bladder 08/03/2012  . Increased frequency of urination 08/03/2012  . Nocturia 08/03/2012    Past Surgical History:  Procedure Laterality Date  . BOWEL RESECTION  07/13/2018   Procedure: SMALL BOWEL RESECTION;  Surgeon: Olean Ree, MD;  Location: [facility] ORS;  Service: General;;  . CORONARY ANGIOPLASTY WITH STENT PLACEMENT    . ESOPHAGOGASTRODUODENOSCOPY (EGD) WITH PROPOFOL N/A 07/24/2018   Procedure: ESOPHAGOGASTRODUODENOSCOPY (EGD) WITH PROPOFOL;  Surgeon: Jonathon Bellows, MD;  Location: [facility] ENDOSCOPY;  Service: Gastroenterology;  Laterality: N/A;  . HERNIA REPAIR    . INGUINAL HERNIA REPAIR Right 07/13/2018   Procedure: HERNIA REPAIR INGUINAL ADULT;  Surgeon: Olean Ree, MD;  Location: [facility] ORS;  Service: General;  Laterality: Right;  . JOINT REPLACEMENT      Prior to Admission medications   Medication Sig Start Date End Date Taking? Authorizing Provider  albuterol (VENTOLIN HFA) 108 (90 Base) MCG/ACT inhaler Inhale into the lungs every 6 (six) hours as needed for wheezing or shortness of breath.    [provider]  amLODipine (NORVASC) 10 MG tablet Take 10 mg by mouth daily.    [provider]  apixaban (ELIQUIS) 5 MG TABS tablet  Take 5 mg by mouth.    [provider]  aspirin EC 81 MG tablet Take 81 mg by mouth daily.    [provider]  budesonide-formoterol (SYMBICORT) 160-4.5 MCG/ACT inhaler Inhale 2 puffs into the lungs 2 (two) times daily.    [provider]  docusate sodium (COLACE) 100 MG capsule Take 100 mg by mouth 2  (two) times daily.    [provider]  finasteride (PROSCAR) 5 MG tablet Take 5 mg by mouth daily.    [provider]  gentamicin cream (GARAMYCIN) 0.1 % Apply 1 application topically 2 (two) times daily. 03/28/20   [name], [name]  Melatonin 5 MG TABS Take 1 tablet by mouth at bedtime.     [provider]  metoprolol succinate (TOPROL-XL) 25 MG 24 hr tablet Take 25 mg by mouth 2 (two) times daily.    [provider]  senna-docusate (SENOKOT-S) 8.6-50 MG tablet Take 1 tablet by mouth daily.    [provider]  tamsulosin (FLOMAX) 0.4 MG CAPS capsule Take 0.4 mg by mouth daily.     [provider]  tiotropium (SPIRIVA) 18 MCG inhalation capsule Place 18 mcg into inhaler and inhale daily.    [provider]  traZODone (DESYREL) 50 MG tablet Take 50 mg by mouth at bedtime as needed for sleep.    [provider]  vitamin B-12 (CYANOCOBALAMIN) 1000 MCG tablet Take 1,000 mcg by mouth daily.    [provider]  Vitamin D3 (VITAMIN D) 25 MCG tablet Take 1,000 Units by mouth daily.    [provider]    Allergies Patient has no known allergies.  Family History  Problem Relation Age of Onset  . Stomach cancer Mother   . Heart attack Father   . Diabetes Father   . Cancer Brother     Social History Social History   Tobacco Use  . Smoking status: Former Smoker    Packs/day: 1.50    Years: 40.00    Pack years: 60.00    Types: Cigarettes    Quit date: 07/30/1996    Years since quitting: 24.3  . Smokeless tobacco: Never Used  Vaping Use  . Vaping Use: Never used  Substance Use Topics  . Alcohol use: Yes    Alcohol/week: 4.0 standard drinks    Types: 4 Shots of liquor per week  . Drug use: No    Review of Systems  Unable to be accurately assessed due to patient's dementia at baseline _________________________________   PHYSICAL EXAM:  VITAL SIGNS: Vitals:   11/30/20 1844 11/30/20 1919   BP:  (!) 152/118  Pulse:  89  Resp:  17  Temp:    SpO2: 96% 95%     Constitutional: Alert and pleasantly disoriented.  Well appearing without distress. Eyes: Conjunctivae are normal. PERRL. EOMI. no signs of EOM entrapment. Head: Small left temporal hematoma without discrete laceration or signs of bleeding.  No associated bony step-offs. Nose: No congestion/rhinnorhea. Mouth/Throat: Mucous membranes are moist.  Oropharynx non-erythematous. Neck: No stridor. No cervical spine tenderness to palpation. Cardiovascular: Normal rate, regular rhythm. Grossly normal heart sounds.  Good peripheral circulation. Respiratory: Normal respiratory effort.  No retractions. Lungs CTAB. Gastrointestinal: Soft , nondistended, nontender to palpation. No CVA tenderness. Musculoskeletal: No lower extremity tenderness nor edema.  No joint effusions. No signs of acute trauma. Neurologic:  Normal speech and language. No gross focal neurologic deficits are appreciated Cranial nerves II through XII intact 5/5 strength  and sensation in all 4 extremities Skin:  Skin is warm, dry and intact. No rash noted. Psychiatric: Mood and affect are normal. Speech and behavior are normal.  ____________________________________________   LABS (all labs ordered are listed, but only abnormal results are displayed)  Labs Reviewed - No data to display ____________________________________________  12 Lead EKG A. fib, rate of 83 bpm.  Leftward axis.  Normal intervals.  No evidence of acute ischemia. ____________________________________________  RADIOLOGY  ED MD interpretation: CT head reviewed by me with tiny foci of SAH to the right parietal periphery Plain film of the left shoulder reviewed by me without evidence of fracture or dislocation.  Official radiology report(s): CT Head Wo Contrast  Result Date: 11/30/2020 CLINICAL DATA:  80 year old male with head trauma. EXAM: CT HEAD WITHOUT CONTRAST TECHNIQUE: Contiguous  axial images were obtained from the base of the skull through the vertex without intravenous contrast. COMPARISON:  Head CT dated 11/30/2020 FINDINGS: Evaluation of this exam is limited due to motion artifact. Brain: There is age-related atrophy and chronic microvascular ischemic changes. No significant interval change in the tiny focus of subarachnoid bleed in the right temporal lobe. No new hemorrhage. No mass effect or midline shift. Vascular: No hyperdense vessel or unexpected calcification. Skull: Normal. Negative for fracture or focal lesion. Sinuses/Orbits: Mild mucoperiosteal thickening of paranasal sinuses. No air-fluid. The mastoid air cells are clear. Other: None IMPRESSION: No interval change.  No new bleed. Electronically Signed   By: Anner Crete M.D.   On: 11/30/2020 22:34   CT Head Wo Contrast  Result Date: 11/30/2020 CLINICAL DATA:  80 year old male with head trauma. EXAM: CT HEAD WITHOUT CONTRAST TECHNIQUE: Contiguous axial images were obtained from the base of the skull through the vertex without intravenous contrast. COMPARISON:  None. FINDINGS: Brain: Mild age-related atrophy and moderate chronic microvascular ischemic changes. There is a tiny focus of subarachnoid hemorrhage in the right temporal lobe (14/2 and coronal 43/4). No other acute intracranial hemorrhage. No mass effect or midline shift. Vascular: No hyperdense vessel or unexpected calcification. Skull: Normal. Negative for fracture or focal lesion. Sinuses/Orbits: Mild mucoperiosteal thickening of paranasal sinuses. No air-fluid level. The mastoid air cells are clear. Other: None IMPRESSION: 1. Tiny focus of subarachnoid hemorrhage in the right temporal lobe. 2. Mild age-related atrophy and chronic microvascular ischemic changes. These results were called by telephone at the time of interpretation on 11/30/2020 at 6:07 pm to provider [facility] , who verbally acknowledged these results. Electronically Signed   By: Anner Crete M.D.   On: 11/30/2020 18:10   DG Shoulder Left Port  Result Date: 11/30/2020 CLINICAL DATA:  80 year old male with fall and trauma to the left shoulder. EXAM: LEFT SHOULDER COMPARISON:  None. FINDINGS: There is no acute fracture or dislocation. The bones are osteopenic. Mild degenerative changes. The soft tissues are unremarkable. IMPRESSION: No acute fracture or dislocation. Electronically Signed   By: Anner Crete M.D.   On: 11/30/2020 18:18   ____________________________________________   PROCEDURES and INTERVENTIONS  Procedure(s) performed (including Critical Care):  .1-3 Lead EKG Interpretation Performed by: Vladimir Crofts, MD Authorized by: Vladimir Crofts, MD     Interpretation: normal     ECG rate:  76   ECG rate assessment: normal     Rhythm: atrial fibrillation     Ectopy: none     Conduction: normal      Medications  acetaminophen (TYLENOL) tablet 1,000 mg (1,000 mg Oral Given 11/30/20 1844)    ____________________________________________  MDM / ED COURSE   80 year old male on Eliquis for A. fib presents after mechanical fall, found to have a tiny subarachnoid hemorrhage, requiring observation period with repeat stable imaging, and ultimately amenable to outpatient management.  Mild hypertension, likely at his baseline, vitals otherwise normal on room air.  Exam reassuring without evidence of distress, neurovascular deficits or significant trauma.  He has a very small hematoma and abrasion to his left temple room without discrete laceration, evidence of skull fracture or open injury.  No hemotympanum or battle sign.  No neck pain or tenderness to suggest cervical injury.  Initial CT head demonstrates a tiny foci of subarachnoid hemorrhage without shift.  Plain films left shoulder without fracture or dislocation.  Discussed the case with neurosurgery on-call, who recommends holding his Eliquis for 1 week, 4-hour observation time and repeat imaging.  No expansion of  this SAH or additional pathology.  No evidence of pathology to preclude outpatient management.  Return precautions for the ED were discussed prior to discharge.   Clinical Course as of 11/30/20 2243  Thu Nov 30, 2020  1810 Call from radiology about a tiny foci of subarachnoid hemorrhage.  I reviewed the images with him and do noticed tiny foci of blood. [DS]  3762 Dr. Lacinda Axon, NSGY on call. Hold eliquis for 1 week. Repeat scan in 4 hours. If no significant change, may dc home [DS]  1828 Updated patient and wife of this.  [DS]  2242 Reassessed.  Patient continues to look well.  Wife has no concerns.  We discussed stable CT head and we discussed neurosurgery recommendations of holding his Eliquis for 1 week.  Answered questions. [DS]    Clinical Course User Index [DS] Vladimir Crofts, MD    ____________________________________________   FINAL CLINICAL IMPRESSION(S) / ED DIAGNOSES  Final diagnoses:  [facility] (subarachnoid hemorrhage) ([place])  Anticoagulated     ED Discharge Orders    None           Note:  This document was prepared using Dragon voice recognition software and may include unintentional dictation errors.   Vladimir Crofts, MD 11/30/20 2245

## 2020-11-30 NOTE — ED Notes (Addendum)
First Nurse-pt brought in via ems from home with a fall at 6am today.  Pt has left shoulder pain and bruise to forehead.  Pt on eloquis. Hx afib, htn, dementia  204/109, 65, 100% room air per ems  Pt alert

## 2021-01-04 ENCOUNTER — Ambulatory Visit: Payer: Medicare PPO | Admitting: Podiatry

## 2021-02-06 IMAGING — CT CT HEAD W/O CM
3 series · 16 of 47 positions shown, 19 images · non-contrast
Comparison: None.

CLINICAL DATA: 79-year-old male with head trauma.

EXAM:
CT HEAD WITHOUT CONTRAST
TECHNIQUE: Contiguous axial images were obtained from the base of the skull
through the vertex without intravenous contrast.

[Series 2: head wo · axial · 0.46mm/px · z∈[-144,-19]mm · 10 of 30 slices shown, 13 images]
[im 3/30  brain]
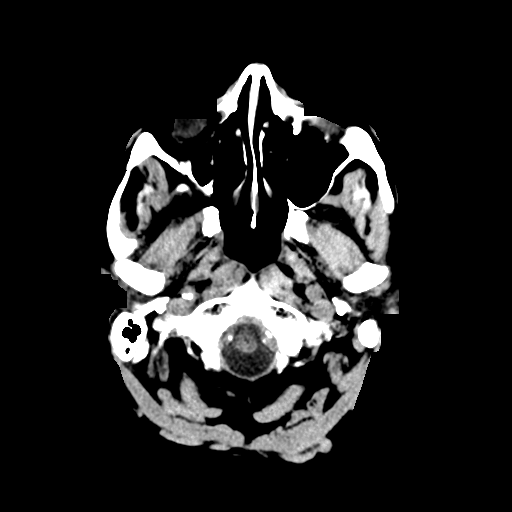
[im 3/30  bone]
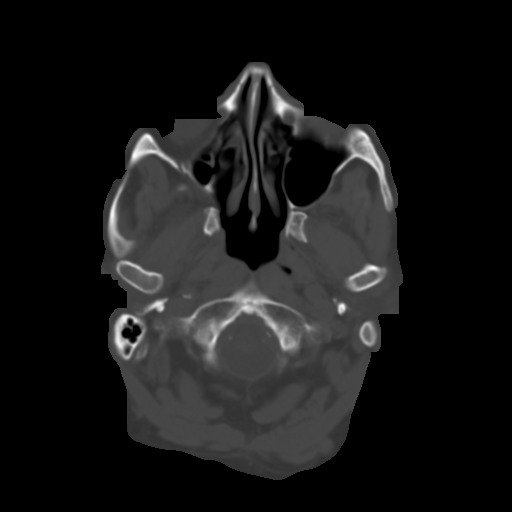
[im 6/30  brain]
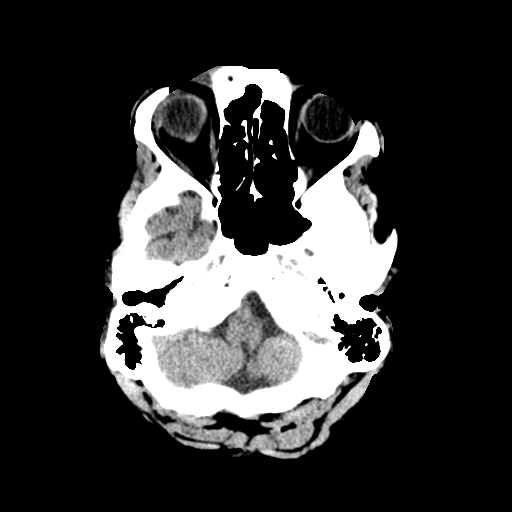
[im 9/30  brain]
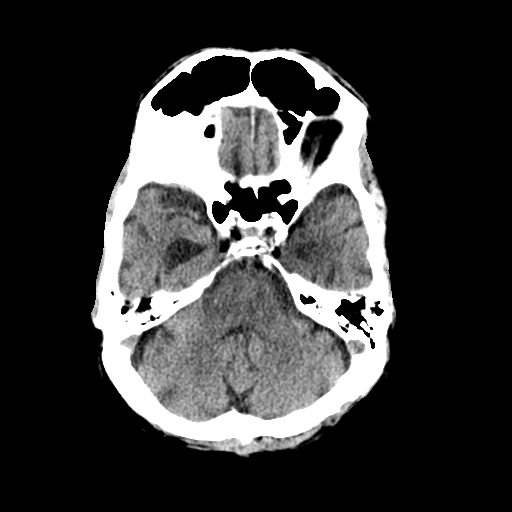
[im 11/30  brain]
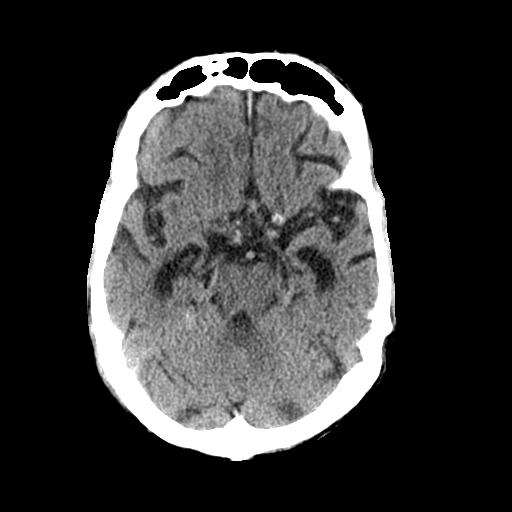
[im 14/30  brain]
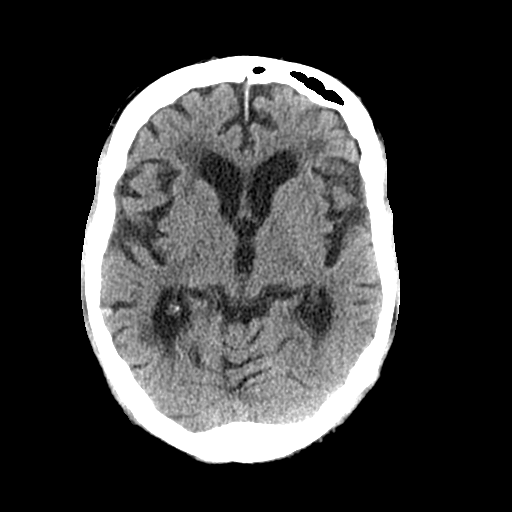
[im 14/30  bone]
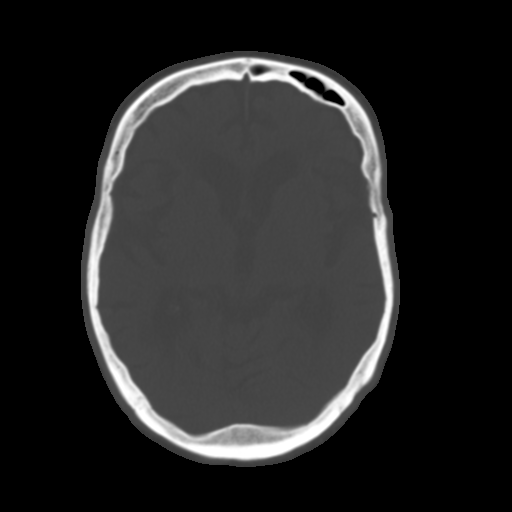
[im 17/30  brain]
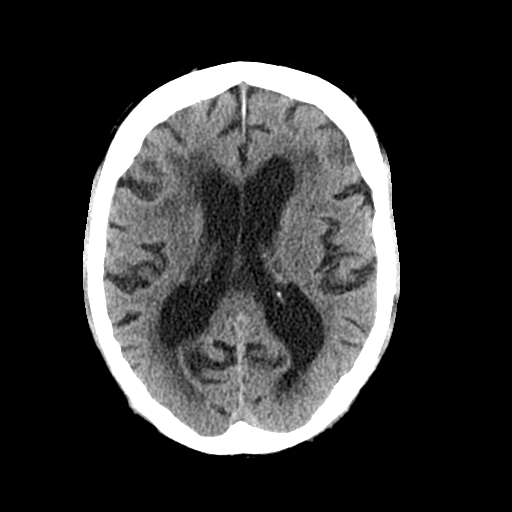
[im 20/30  brain]
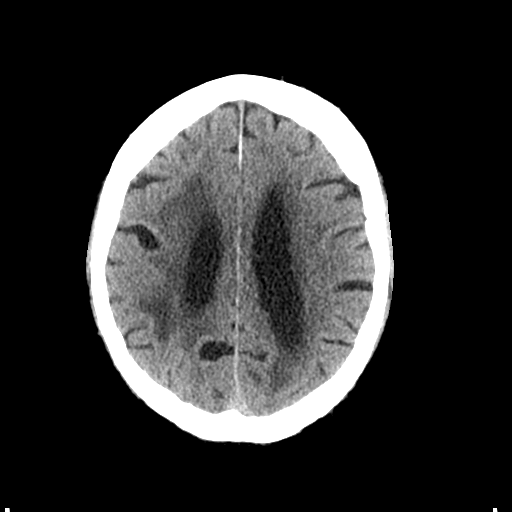
[im 23/30  brain]
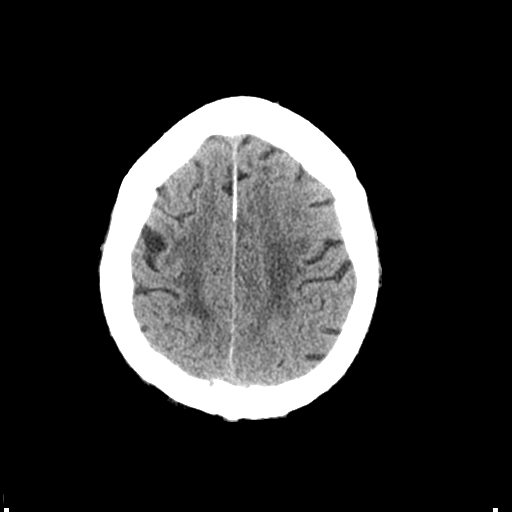
[im 25/30  brain]
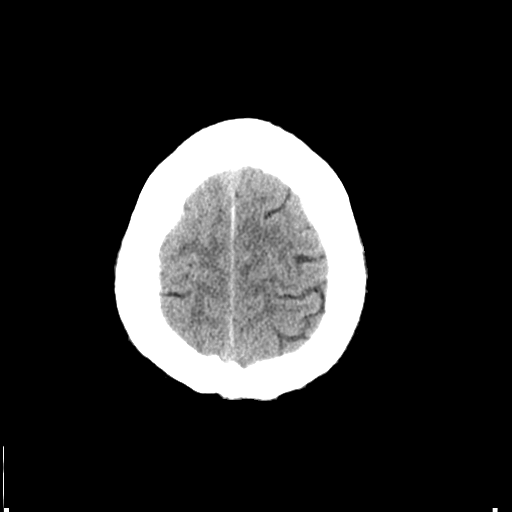
[im 25/30  bone]
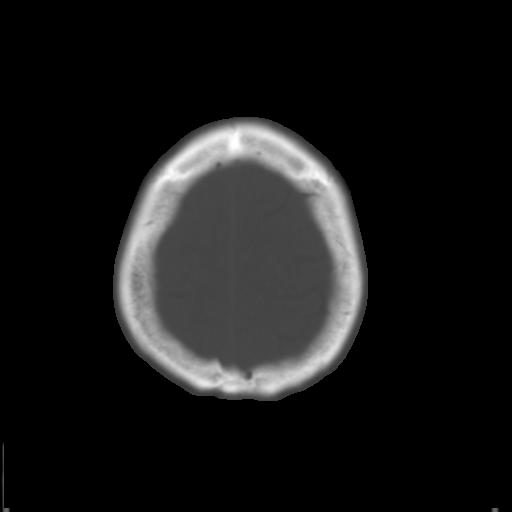
[im 28/30  brain]
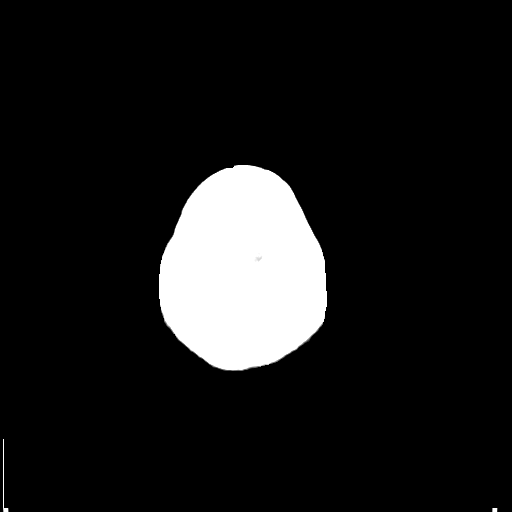

[Series 4: coronal soft tissue · coronal · 0.33mm/px · 3 of 68 slices shown]
[im 23/68  brain]
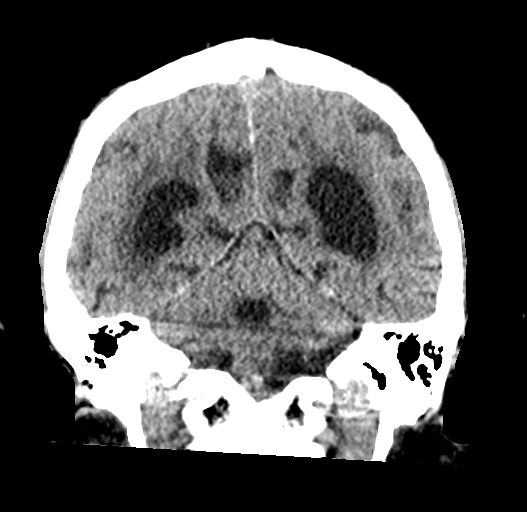
[im 30/68  brain]
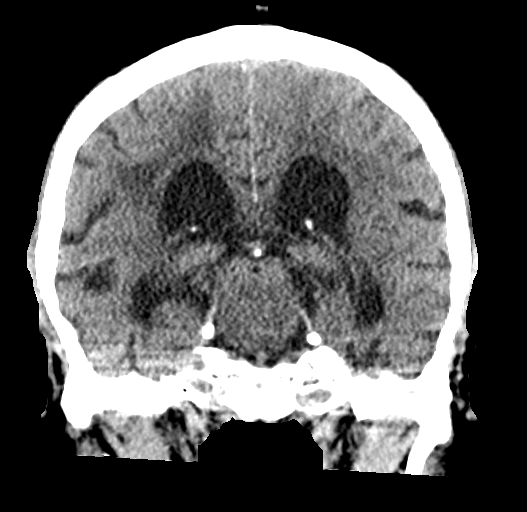
[im 38/68  brain]
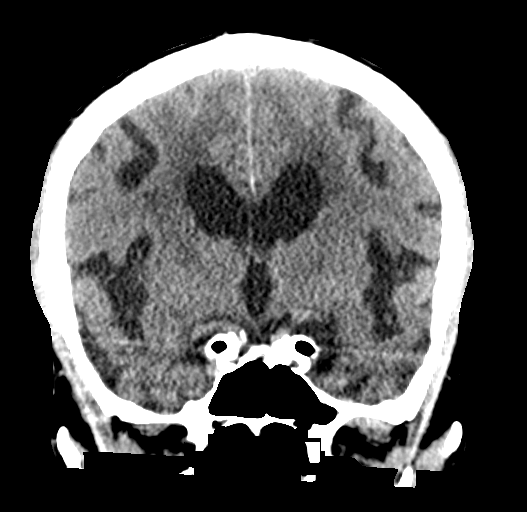

[Series 5: sagittal soft tissue · sagittal · 0.33mm/px · 3 of 59 slices shown]
[im 20/59  brain]
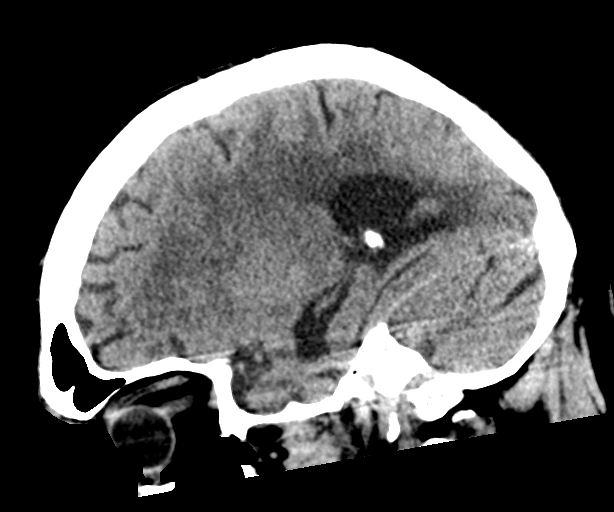
[im 30/59  brain]
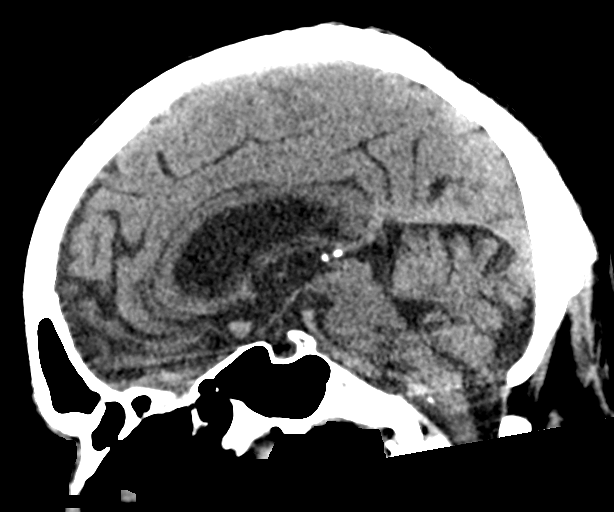
[im 39/59  brain]
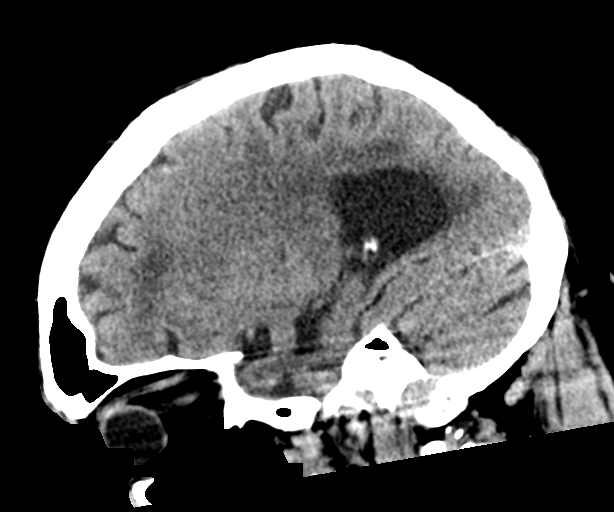

[16 of 47 positions shown; findings below may reference images not displayed]

FINDINGS: Brain: Mild age-related atrophy and moderate chronic microvascular
ischemic changes. There is a tiny focus of subarachnoid hemorrhage
in the right temporal lobe ([DATE] and coronal 43/4). No other acute
intracranial hemorrhage. No mass effect or midline shift.

Vascular: No hyperdense vessel or unexpected calcification.

Skull: Normal. Negative for fracture or focal lesion.

Sinuses/Orbits: Mild mucoperiosteal thickening of paranasal sinuses.
No air-fluid level. The mastoid air cells are clear.

Other: None
IMPRESSION: 1. Tiny focus of subarachnoid hemorrhage in the right temporal lobe.
2. Mild age-related atrophy and chronic microvascular ischemic
changes.

These results were called by telephone at the time of interpretation
on 11/30/2020 at [DATE] to provider BENRABAH ETOIL , who verbally
acknowledged these results.

## 2021-02-06 IMAGING — CT CT HEAD W/O CM
3 of 6 series · 14 of 47 positions shown, 17 images · non-contrast
Comparison: Head CT dated 11/30/2020

CLINICAL DATA: 79-year-old male with head trauma.

EXAM:
CT HEAD WITHOUT CONTRAST
TECHNIQUE: Contiguous axial images were obtained from the base of the skull
through the vertex without intravenous contrast.

[Series 3: head wo · axial · 0.44mm/px · z∈[-139,-9]mm · 9 of 30 slices shown, 12 images]
[im 2/30  brain]
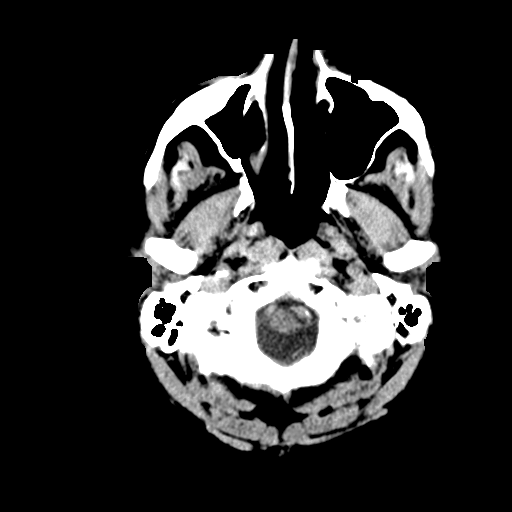
[im 2/30  bone]
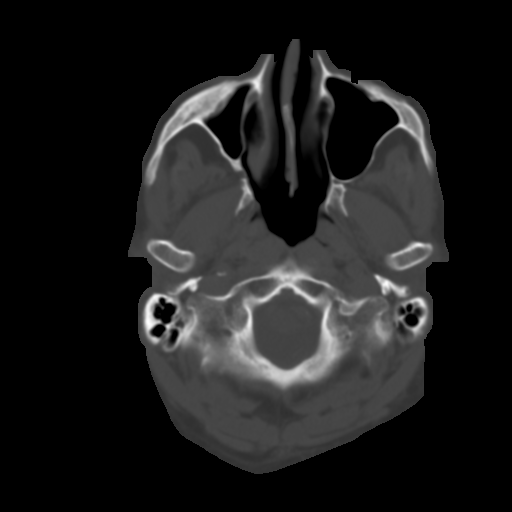
[im 5/30  brain]
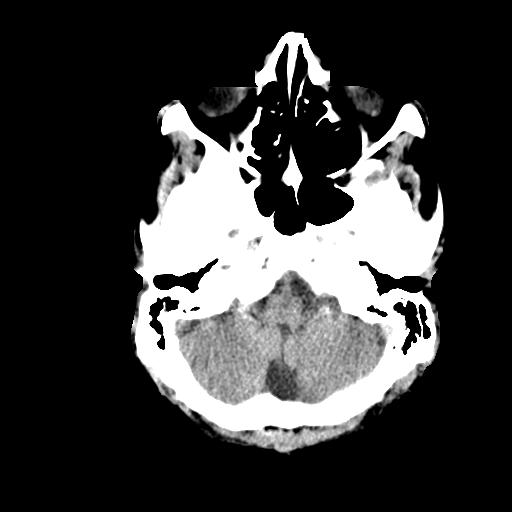
[im 9/30  brain]
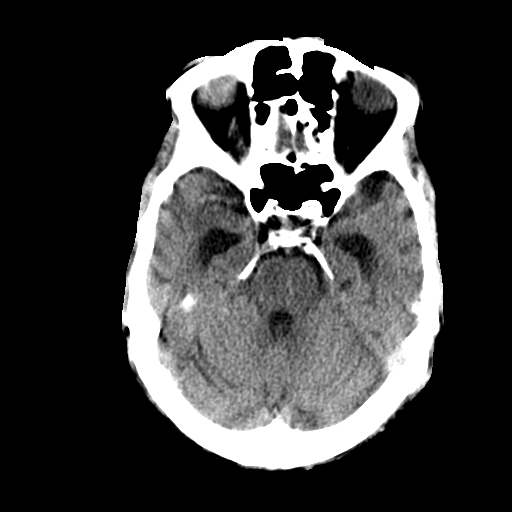
[im 12/30  brain]
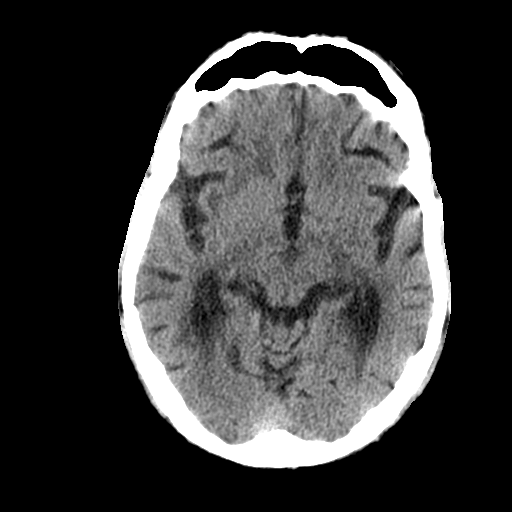
[im 15/30  brain]
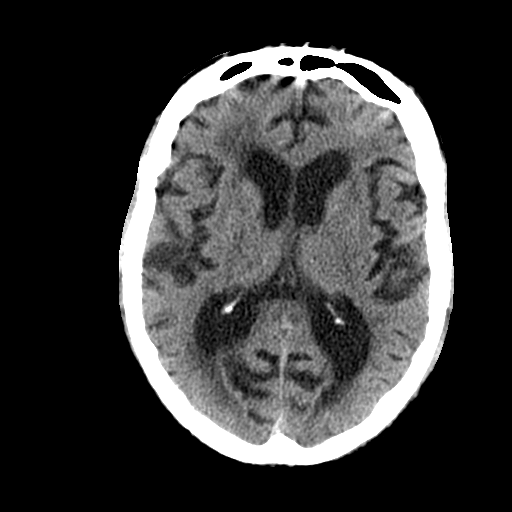
[im 15/30  bone]
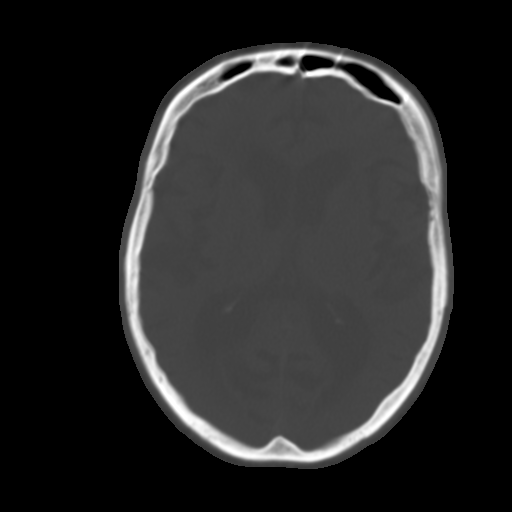
[im 18/30  brain]
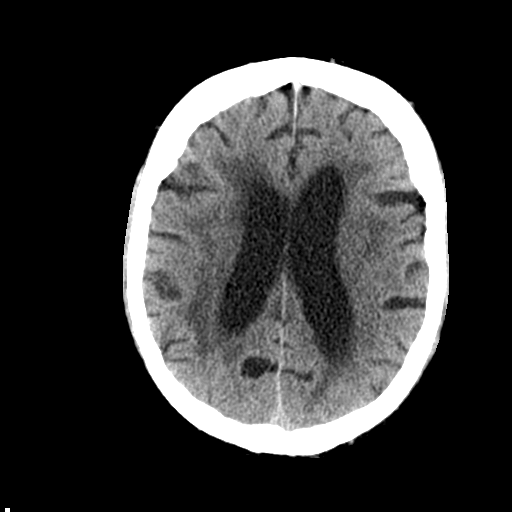
[im 21/30  brain]
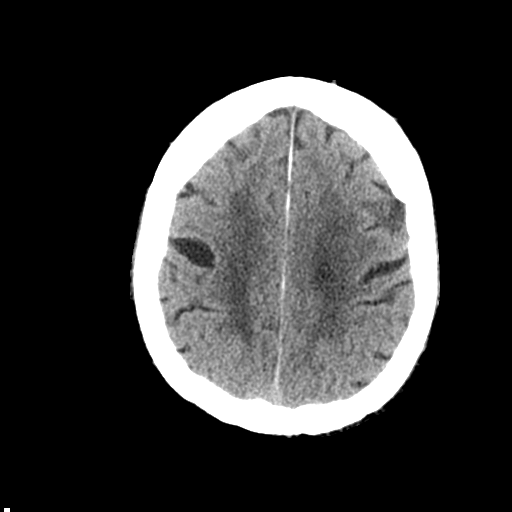
[im 25/30  brain]
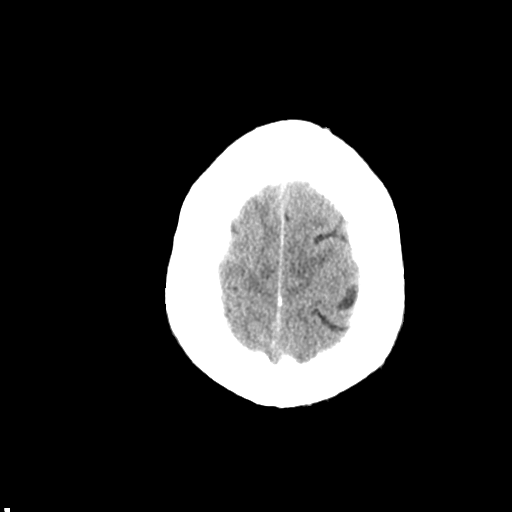
[im 28/30  brain]
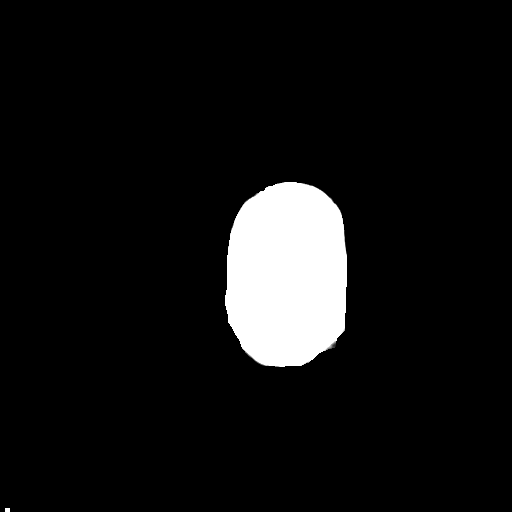
[im 28/30  bone]
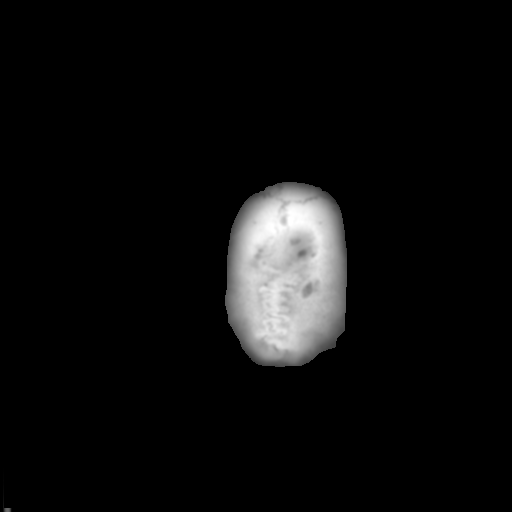

[Series 4: coronal soft tissue · coronal · 0.31mm/px · 3 of 66 slices shown]
[im 17/66  brain]
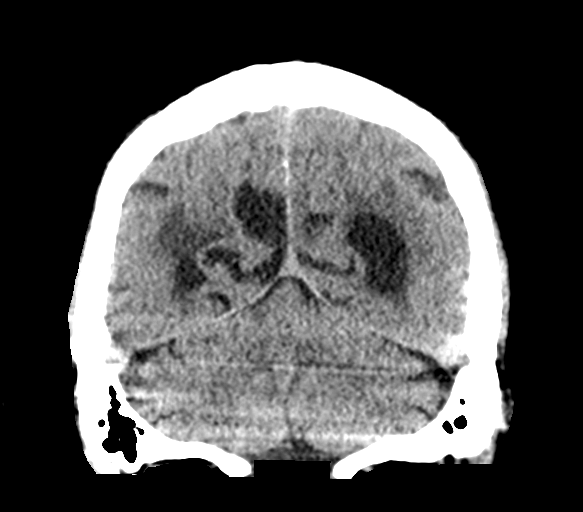
[im 33/66  brain]
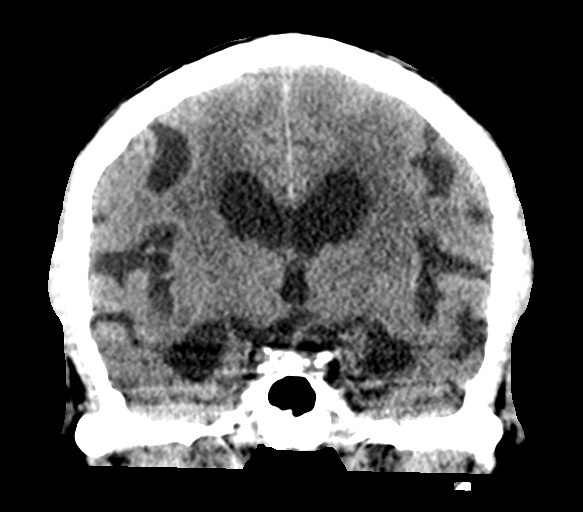
[im 49/66  brain]
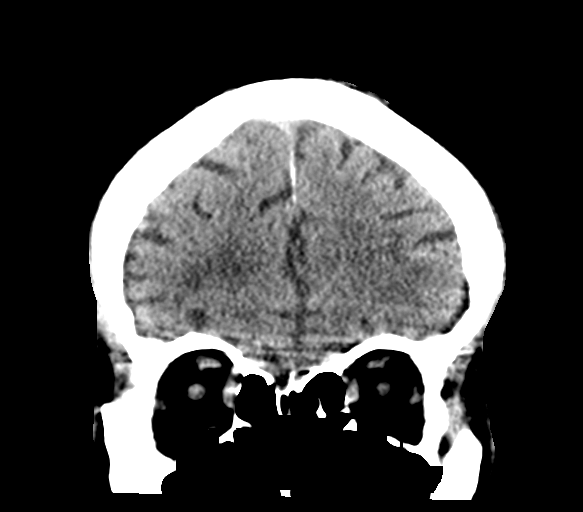

[Series 9: sagittal soft tissue · sagittal · 0.21mm/px · 2 of 57 slices shown]
[im 19/57  brain]
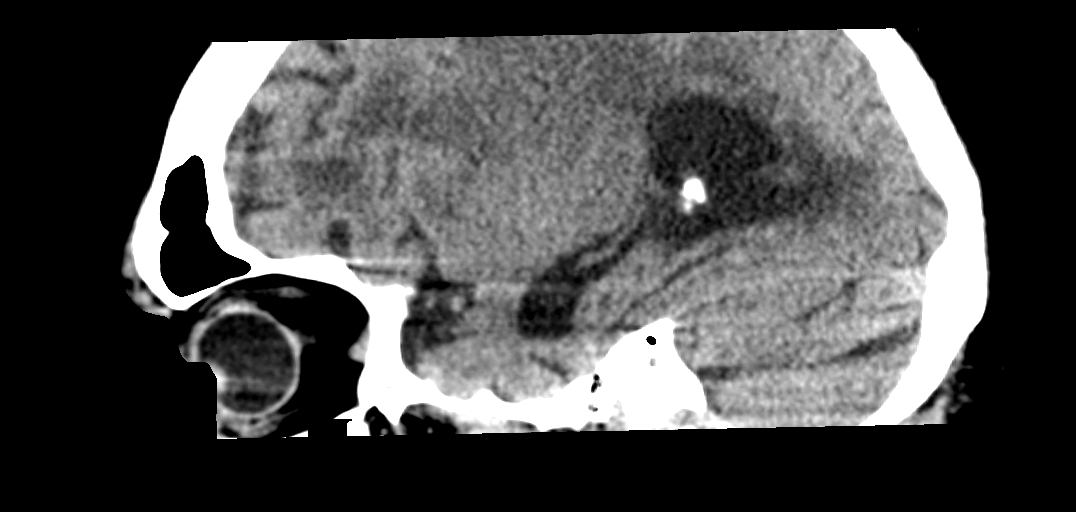
[im 38/57  brain]
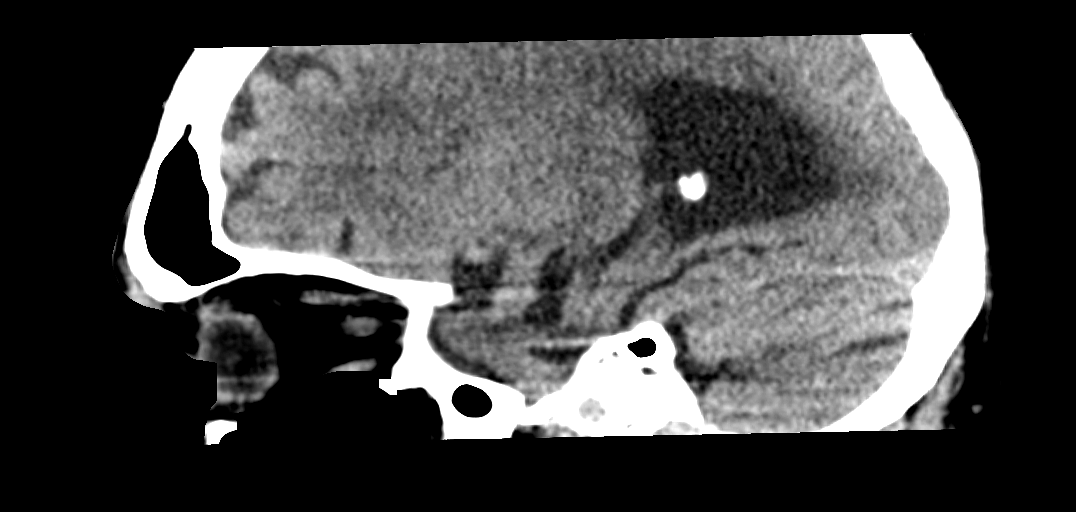

[14 of 47 positions shown; findings below may reference images not displayed]

FINDINGS: Evaluation of this exam is limited due to motion artifact.

Brain: There is age-related atrophy and chronic microvascular
ischemic changes. No significant interval change in the tiny focus
of subarachnoid bleed in the right temporal lobe. No new hemorrhage.
No mass effect or midline shift.

Vascular: No hyperdense vessel or unexpected calcification.

Skull: Normal. Negative for fracture or focal lesion.

Sinuses/Orbits: Mild mucoperiosteal thickening of paranasal sinuses.
No air-fluid. The mastoid air cells are clear.

Other: None
IMPRESSION: No interval change.  No new bleed.

## 2022-03-08 ENCOUNTER — Ambulatory Visit: Payer: Medicare PPO | Admitting: Podiatry

## 2022-03-08 DIAGNOSIS — M79675 Pain in left toe(s): Secondary | ICD-10-CM | POA: Diagnosis not present

## 2022-03-08 DIAGNOSIS — B351 Tinea unguium: Secondary | ICD-10-CM

## 2022-03-08 DIAGNOSIS — M79674 Pain in right toe(s): Secondary | ICD-10-CM | POA: Diagnosis not present

## 2022-03-08 NOTE — Progress Notes (Signed)
? ?  SUBJECTIVE ?Patient presents to office today complaining of elongated, thickened nails that cause pain while ambulating in shoes.  Patient is unable to trim their own nails. Patient is here for further evaluation and treatment. ? ?Past Medical History:  ?Diagnosis Date  ? Atrial fibrillation ([place])   ? BPH (benign prostatic hyperplasia)   ? COPD (chronic obstructive pulmonary disease) ([place])   ? Coronary artery disease   ? Dementia ([place])   ? Hypertension   ? Lymphoma ([place])   ? ? ?OBJECTIVE ?General Patient is awake, alert, and oriented x 3 and in no acute distress. ?Derm Skin is dry and supple bilateral. Negative open lesions or macerations. Remaining integument unremarkable. Nails are tender, long, thickened and dystrophic with subungual debris, consistent with onychomycosis, 1-5 bilateral. No signs of infection noted. ?Vasc  DP and PT pedal pulses palpable bilaterally. Temperature gradient within normal limits.  ?Neuro Epicritic and protective threshold sensation grossly intact bilaterally.  ?Musculoskeletal Exam No symptomatic pedal deformities noted bilateral. Muscular strength within normal limits. ? ?ASSESSMENT ?1.  Pain due to onychomycosis of toenails both ? ?PLAN OF CARE ?1. Patient evaluated today.  ?2. Instructed to maintain good pedal hygiene and foot care.  ?3. Mechanical debridement of nails 1-5 bilaterally performed using a nail nipper. Filed with dremel without incident.  ?4. Return to clinic in 3 mos.  ? ? ?[name], [name] ?[place] ? ?Dr. [name], [name]  ?  ?[address]                                     ?[place], [place] [address]                ?Office [phone]  ?Fax [phone] ? ? ? ? ?

## 2022-06-11 ENCOUNTER — Ambulatory Visit: Payer: Medicare PPO | Admitting: Podiatry

## 2022-07-25 ENCOUNTER — Ambulatory Visit: Payer: Medicare PPO | Admitting: Podiatry

## 2022-07-25 ENCOUNTER — Encounter: Payer: Self-pay | Admitting: Podiatry

## 2022-07-25 DIAGNOSIS — M79674 Pain in right toe(s): Secondary | ICD-10-CM | POA: Diagnosis not present

## 2022-07-25 DIAGNOSIS — D689 Coagulation defect, unspecified: Secondary | ICD-10-CM | POA: Diagnosis not present

## 2022-07-25 DIAGNOSIS — B351 Tinea unguium: Secondary | ICD-10-CM

## 2022-07-25 DIAGNOSIS — M79675 Pain in left toe(s): Secondary | ICD-10-CM

## 2022-07-25 NOTE — Progress Notes (Signed)
This patient returns to my office for at risk foot care.  This patient requires this care by a professional since this patient will be at risk due to having  coagulation defect due to eliquis.  This patient is unable to cut nails himself since the patient cannot reach his nails.These nails are painful walking and wearing shoes.  This patient presents for at risk foot care today.  General Appearance  Alert, conversant and in no acute stress.  Vascular  Dorsalis pedis and posterior tibial  pulses are palpable  bilaterally.  Capillary return is within normal limits  bilaterally. Temperature is within normal limits  bilaterally.  Neurologic  Senn-Weinstein monofilament wire test within normal limits  bilaterally. Muscle power within normal limits bilaterally.  Nails Thick disfigured discolored nails with subungual debris  from hallux to fifth toes bilaterally. No evidence of bacterial infection or drainage bilaterally.  Orthopedic  No limitations of motion  feet .  No crepitus or effusions noted.  No bony pathology or digital deformities noted.  Skin  normotropic skin with no porokeratosis noted bilaterally.  No signs of infections or ulcers noted.     Onychomycosis  Pain in right toes  Pain in left toes  Consent was obtained for treatment procedures.   Mechanical debridement of nails 1-5  bilaterally performed with a nail nipper.  Filed with dremel without incident.    Return office visit  3 months                   Told patient to return for periodic foot care and evaluation due to potential at risk complications.     [name]   

## 2022-09-24 ENCOUNTER — Ambulatory Visit (LOCAL_COMMUNITY_HEALTH_CENTER): Payer: Medicare PPO

## 2022-09-24 DIAGNOSIS — Z23 Encounter for immunization: Secondary | ICD-10-CM | POA: Diagnosis not present

## 2022-09-24 DIAGNOSIS — Z719 Counseling, unspecified: Secondary | ICD-10-CM

## 2022-09-24 NOTE — Progress Notes (Signed)
  Are you feeling sick today? No   Have you ever received a dose of COVID-19 Vaccine? [employer], [place], [place], [place], Other) Yes  If yes, which vaccine and how many doses?   PFIZER, 4   Did you bring the vaccination record card or other documentation?  Yes   Do you have a health condition or are undergoing treatment that makes you moderately or severely immunocompromised? This would include, but not be limited to: cancer, HIV, organ transplant, immunosuppressive therapy/high-dose corticosteroids, or moderate/severe primary immunodeficiency.  No  Have you received COVID-19 vaccine before or during hematopoietic cell transplant (HCT) or CAR-T-cell therapies? No  Have you ever had an allergic reaction to: (This would include a severe allergic reaction or a reaction that caused hives, swelling, or respiratory distress, including wheezing.) A component of a COVID-19 vaccine or a previous dose of COVID-19 vaccine? No   Have you ever had an allergic reaction to another vaccine (other thanCOVID-19 vaccine) or an injectable medication? (This would include a severe allergic reaction or a reaction that caused hives, swelling, or respiratory distress, including wheezing.)   NO    Do you have a history of any of the following:  Myocarditis or Pericarditis No  Dermal fillers:  No  Multisystem Inflammatory Syndrome (MIS-C or MIS-A)? No  COVID-19 disease within the past 3 months? No  Vaccinated with monkeypox vaccine in the last 4 weeks? No  Eligible, administered [place] [phone] per pt preference. Pt refused 15 min observation post vaccination. M., LPN.

## 2022-09-27 DIAGNOSIS — Z85828 Personal history of other malignant neoplasm of skin: Secondary | ICD-10-CM | POA: Insufficient documentation

## 2022-10-31 ENCOUNTER — Ambulatory Visit: Payer: Medicare PPO | Admitting: Podiatry

## 2022-11-03 ENCOUNTER — Ambulatory Visit
Admission: EM | Admit: 2022-11-03 | Discharge: 2022-11-03 | Disposition: A | Discharge status: Home / Self Care | Payer: Medicare PPO | Attending: Emergency Medicine | Admitting: Emergency Medicine

## 2022-11-03 ENCOUNTER — Encounter: Payer: Self-pay | Admitting: Emergency Medicine

## 2022-11-03 DIAGNOSIS — J069 Acute upper respiratory infection, unspecified: Secondary | ICD-10-CM | POA: Diagnosis not present

## 2022-11-03 MED ORDER — BENZONATATE 100 MG PO CAPS: 100.0000 mg | ORAL_CAPSULE | Freq: Three times a day (TID) | ORAL | 0 refills | Status: DC

## 2022-11-03 MED ORDER — AMOXICILLIN-POT CLAVULANATE 875-125 MG PO TABS: 1.0000 | ORAL_TABLET | Freq: Two times a day (BID) | ORAL | 0 refills | Status: DC

## 2022-11-03 NOTE — Discharge Instructions (Addendum)
Begin use of Augmentin every morning and every evening for 7 days to cover for bacteria which may be prolonging the symptoms  May use Tessalon Perles every 8 hours to help calm coughing  You can take Tylenol and/or Ibuprofen as needed for fever reduction and pain relief.   For cough: honey 1/2 to 1 teaspoon (you can dilute the honey in water or another fluid).  You can also use guaifenesin and dextromethorphan for cough. You can use a humidifier for chest congestion and cough.  If you don't have a humidifier, you can sit in the bathroom with the hot shower running.      For sore throat: try warm salt water gargles, cepacol lozenges, throat spray, warm tea or water with lemon/honey, popsicles or ice, or OTC cold relief medicine for throat discomfort.   For congestion: take a daily anti-histamine like Zyrtec, Claritin, and a oral decongestant, such as pseudoephedrine.  You can also use Flonase 1-2 sprays in each nostril daily.   It is important to stay hydrated: drink plenty of fluids (water, gatorade/powerade/pedialyte, juices, or teas) to keep your throat moisturized and help further relieve irritation/discomfort.

## 2022-11-03 NOTE — ED Provider Notes (Signed)
[facility]    CSN: [id number] Arrival date & time: 11/03/22  1028      History   Chief Complaint Chief Complaint  Patient presents with   Cough    HPI Victor Ramos is a 82 y.o. male.   Patient presents for evaluation of nasal congestion, rhinorrhea, cough and shortness of breath with exertion present for 10 days.  Known sick contact prior.  Tolerating food and liquids.  Has attempted use of Tylenol, Tessalon and Coricidin without relief.  History of COPD.  Denies wheezing, chest pain or tightness, fever, chills or bodyaches.    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   Gout attack 03/28/2020   Knee pain 03/28/2020   Localized, primary osteoarthritis 03/28/2020   Osteoarthritis of knee 03/28/2020   Goals of care, counseling/discussion 08/23/2018   Symptomatic anemia    CLL (chronic lymphocytic leukemia) ([place])    Lower GI bleed 07/22/2018   Recurrent inguinal hernia of right side with obstruction 07/13/2018   CAD (coronary artery disease) 04/13/2015   Chronic a-fib ([place]) 04/13/2015   Acute gouty arthropathy 05/11/2014   Arthropathy 05/11/2014   Benign essential HTN 05/11/2014   Cardiac dysrhythmia 05/11/2014   Dysuria 05/19/2013   Gross hematuria 04/14/2013   Cardiovascular disease 10/13/2012   Hyperlipidemia 10/13/2012   Benign localized prostatic hyperplasia with lower urinary tract symptoms (LUTS) 08/03/2012   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   Elevated prostate specific antigen (PSA) 08/03/2012   Incomplete emptying of bladder 08/03/2012   Increased frequency of urination 08/03/2012   Nocturia  08/03/2012    Past Surgical History:  Procedure Laterality Date   BOWEL RESECTION  07/13/2018   Procedure: SMALL BOWEL RESECTION;  Surgeon: Olean Ree, MD;  Location: [facility] ORS;  Service: General;;   CORONARY ANGIOPLASTY WITH STENT PLACEMENT     ESOPHAGOGASTRODUODENOSCOPY (EGD) WITH PROPOFOL N/A 07/24/2018   Procedure: ESOPHAGOGASTRODUODENOSCOPY (EGD) WITH PROPOFOL;  Surgeon: Jonathon Bellows, MD;  Location: [facility] ENDOSCOPY;  Service: Gastroenterology;  Laterality: N/A;   HERNIA REPAIR     INGUINAL HERNIA REPAIR Right 07/13/2018   Procedure: HERNIA REPAIR INGUINAL ADULT;  Surgeon: Olean Ree, MD;  Location: [facility] ORS;  Service: General;  Laterality: Right;   JOINT REPLACEMENT         Home Medications    Prior to Admission medications   Medication Sig Start Date End Date Taking? Authorizing Provider  albuterol (VENTOLIN HFA) 108 (90 Base) MCG/ACT inhaler Inhale into the lungs every 6 (six) hours as needed for wheezing or shortness of breath.    [provider]  amLODipine (NORVASC) 10 MG tablet Take 10 mg by mouth daily.    [provider]  apixaban (ELIQUIS) 5 MG TABS tablet Take 5 mg by mouth.    [provider]  aspirin EC 81 MG tablet Take 81 mg by mouth daily.    [provider]  budesonide-formoterol (SYMBICORT) 160-4.5 MCG/ACT inhaler Inhale 2 puffs into the lungs 2 (two) times daily.    [provider]  docusate sodium (COLACE) 100 MG capsule Take 100 mg by mouth 2 (two) times daily.  [provider]  finasteride (PROSCAR) 5 MG tablet Take 5 mg by mouth daily.    [provider]  gentamicin cream (GARAMYCIN) 0.1 % Apply 1 application topically 2 (two) times daily. 03/28/20   [name], [name]  Melatonin 5 MG TABS Take 1 tablet by mouth at bedtime.     [provider]  metoprolol succinate (TOPROL-XL) 25 MG 24 hr tablet Take 25 mg by mouth 2 (two) times daily.    [provider]  senna-docusate  (SENOKOT-S) 8.6-50 MG tablet Take 1 tablet by mouth daily.    [provider]  tamsulosin (FLOMAX) 0.4 MG CAPS capsule Take 0.4 mg by mouth daily.     [provider]  tiotropium (SPIRIVA) 18 MCG inhalation capsule Place 18 mcg into inhaler and inhale daily.    [provider]  traZODone (DESYREL) 50 MG tablet Take 50 mg by mouth at bedtime as needed for sleep.    [provider]  vitamin B-12 (CYANOCOBALAMIN) 1000 MCG tablet Take 1,000 mcg by mouth daily.    [provider]  Vitamin D3 (VITAMIN D) 25 MCG tablet Take 1,000 Units by mouth daily.    [provider]    Family History Family History  Problem Relation Age of Onset   Stomach cancer Mother    Heart attack Father    Diabetes Father    Cancer Brother     Social History Social History   Tobacco Use   Smoking status: Former    Packs/day: 1.50    Years: 40.00    Total pack years: 60.00    Types: Cigarettes    Quit date: 07/30/1996    Years since quitting: 26.2   Smokeless tobacco: Never  Vaping Use   Vaping Use: Never used  Substance Use Topics   Alcohol use: Yes    Alcohol/week: 4.0 standard drinks of alcohol    Types: 4 Shots of liquor per week   Drug use: No     Allergies   Patient has no known allergies.   Review of Systems Review of Systems  HENT:  Positive for congestion and rhinorrhea. Negative for dental problem, drooling, ear discharge, ear pain, facial swelling, hearing loss, mouth sores, nosebleeds, postnasal drip, sinus pressure, sinus pain, sneezing, sore throat, tinnitus, trouble swallowing and voice change.   Respiratory:  Positive for cough and shortness of breath. Negative for apnea, choking, chest tightness, wheezing and stridor.   Cardiovascular: Negative.   Gastrointestinal: Negative.      Physical Exam Triage Vital Signs ED Triage Vitals  Enc Vitals Group     BP 11/03/22 1046 129/66     Pulse Rate 11/03/22 1046 (!) 57     Resp  11/03/22 1046 15     Temp 11/03/22 1046 98.1 F (36.7 C)     Temp Source 11/03/22 1046 Oral     SpO2 11/03/22 1046 95 %     Weight 11/03/22 1044 212 lb 1.3 oz (96.2 kg)     Height 11/03/22 1044 '5\' 11"'$  (1.803 m)     Head Circumference --      Peak Flow --      Pain Score 11/03/22 1044 0     Pain Loc --      Pain Edu? --      Excl. in [place]? --    No data found.  Updated Vital Signs BP 129/66 (BP Location: Left Arm)   Pulse (!) 57   Temp 98.1 F (36.7  C) (Oral)   Resp 15   Ht '5\' 11"'$  (1.803 m)   Wt 212 lb 1.3 oz (96.2 kg)   SpO2 95%   BMI 29.58 kg/m   Visual Acuity Right Eye Distance:   Left Eye Distance:   Bilateral Distance:    Right Eye Near:   Left Eye Near:    Bilateral Near:     Physical Exam Constitutional:      Appearance: Normal appearance.  HENT:     Head: Normocephalic.     Right Ear: Tympanic membrane, ear canal and external ear normal.     Left Ear: Tympanic membrane, ear canal and external ear normal.     Nose: Congestion and rhinorrhea present.     Mouth/Throat:     Mouth: Mucous membranes are moist.     Pharynx: Posterior oropharyngeal erythema present.  Cardiovascular:     Rate and Rhythm: Normal rate and regular rhythm.     Pulses: Normal pulses.     Heart sounds: Normal heart sounds.  Pulmonary:     Effort: Pulmonary effort is normal.     Breath sounds: Normal breath sounds.  Skin:    General: Skin is warm and dry.  Neurological:     Mental Status: He is alert and oriented to person, place, and time. Mental status is at baseline.      UC Treatments / Results  Labs (all labs ordered are listed, but only abnormal results are displayed) Labs Reviewed - No data to display  EKG   Radiology No results found.  Procedures Procedures (including critical care time)  Medications Ordered in UC Medications - No data to display  Initial Impression / Assessment and Plan / UC Course  I have reviewed the triage vital signs and the nursing  notes.  Pertinent labs & imaging results that were available during my care of the patient were reviewed by me and considered in my medical decision making (see chart for details).  Acute upper respiratory infection  Patient is in no signs of distress nor toxic appearing.  Vital signs are stable.  Low suspicion for pneumonia, pneumothorax or bronchitis and therefore will defer imaging.  Prescribed Augemtnin and tessalon . May use additional over-the-counter medications as needed for supportive care.  May follow-up with urgent care as needed if symptoms persist or worsen.   Final Clinical Impressions(s) / UC Diagnoses   Final diagnoses:  None   Discharge Instructions   None    ED Prescriptions   None    PDMP not reviewed this encounter.   Hans Eden, NP 11/03/22 1152

## 2022-11-03 NOTE — ED Triage Notes (Signed)
Patient c/o cough and chest congestion for 1-2 weeks.  Patient denies fevers.

## 2023-01-27 ENCOUNTER — Ambulatory Visit: Payer: Medicare PPO | Admitting: Podiatry

## 2023-01-27 DIAGNOSIS — M79674 Pain in right toe(s): Secondary | ICD-10-CM | POA: Diagnosis not present

## 2023-01-27 DIAGNOSIS — M79675 Pain in left toe(s): Secondary | ICD-10-CM

## 2023-01-27 DIAGNOSIS — B351 Tinea unguium: Secondary | ICD-10-CM

## 2023-01-28 NOTE — Progress Notes (Signed)
  Subjective:  Patient ID: SAXON BORTH, male    DOB: Jun 22, 1941,  MRN: [id number]  Chief Complaint  Patient presents with   Nail Problem    Thick painful toenails, 3 month follow up    82 y.o. male presents with the above complaint. History confirmed with patient.  Nails are thickened elongated causing discomfort in shoe gear.  Intermittent debridement has been helpful in reducing this  Objective:  Physical Exam: warm, good capillary refill, no trophic changes or ulcerative lesions, normal DP and PT pulses, and normal sensory exam. Left Foot: dystrophic yellowed discolored nail plates with subungual debris Right Foot: dystrophic yellowed discolored nail plates with subungual debris   Assessment:   1. Pain due to onychomycosis of toenails of both feet      Plan:  Patient was evaluated and treated and all questions answered.  Discussed the etiology and treatment options for the condition in detail with the patient.  Regular debridement has been helpful in the past. Recommended debridement of the nails today. Sharp and mechanical debridement performed of all painful and mycotic nails today. Nails debrided in length and thickness using a nail nipper to level of comfort. Discussed treatment options including appropriate shoe gear. Follow up as needed for painful nails.    Return in about 3 months (around 04/28/2023), or if symptoms worsen or fail to improve, for painful thick fungal nails.

## 2023-03-14 ENCOUNTER — Other Ambulatory Visit: Payer: Self-pay | Admitting: Cardiovascular Disease

## 2023-03-14 DIAGNOSIS — Z0000 Encounter for general adult medical examination without abnormal findings: Secondary | ICD-10-CM

## 2023-03-27 ENCOUNTER — Ambulatory Visit: Payer: PRIVATE HEALTH INSURANCE

## 2023-03-27 DIAGNOSIS — Z0000 Encounter for general adult medical examination without abnormal findings: Secondary | ICD-10-CM

## 2023-04-28 ENCOUNTER — Encounter: Payer: Self-pay | Admitting: Podiatry

## 2023-04-28 ENCOUNTER — Ambulatory Visit (INDEPENDENT_AMBULATORY_CARE_PROVIDER_SITE_OTHER): Payer: Medicare PPO | Admitting: Podiatry

## 2023-04-28 VITALS — BP 159/92

## 2023-04-28 DIAGNOSIS — M79674 Pain in right toe(s): Secondary | ICD-10-CM

## 2023-04-28 DIAGNOSIS — M79675 Pain in left toe(s): Secondary | ICD-10-CM | POA: Diagnosis not present

## 2023-04-28 DIAGNOSIS — D689 Coagulation defect, unspecified: Secondary | ICD-10-CM | POA: Diagnosis not present

## 2023-04-28 DIAGNOSIS — B351 Tinea unguium: Secondary | ICD-10-CM | POA: Diagnosis not present

## 2023-04-28 NOTE — Progress Notes (Signed)
  Subjective:  Patient ID: Victor Ramos, male    DOB: 1941-06-18,  MRN: [id number]  Victor Ramos presents to clinic today for: painful elongated mycotic toenails 1-5 bilaterally which are tender when wearing enclosed shoe gear. Pain is relieved with periodic professional debridement.  He is accompanied by his wife on today's visit. They voice no new pedal concerns on today's visit. Chief Complaint  Patient presents with   Nail Problem    RFC,Referring Provider Center, [facility],lov:05/24      PCP is [facility].  Allergies  Allergen Reactions   Atorvastatin     Other Reaction(s): Delirium, Memory impairment, Delirium, Memory impairment   Review of Systems: Negative except as noted in the HPI.  Objective: No changes noted in today's physical examination. Vitals:   04/28/23 1327 04/28/23 1335  BP: (!) 164/87 (!) 159/92    Victor Ramos is a pleasant 82 y.o. male in NAD. AAO x 3.  Vascular Examination: Capillary refill time <3 seconds b/l LE. Palpable pedal pulses b/l LE. Digital hair  absent b/l. +1 edema dorsal forefoot b/l. Skin temperature gradient WNL b/l. Varicosities b/l.No ischemia or gangrene noted b/l LE. No cyanosis or clubbing noted b/l LE.Marland Kitchen  Dermatological Examination: Pedal skin with normal turgor, texture and tone b/l. No open wounds. No interdigital macerations b/l. Toenails 1-5 b/l thickened, discolored, dystrophic with subungual debris. There is pain on palpation to dorsal aspect of nailplates. No hyperkeratotic nor porokeratotic lesions present on today's visit.Marland Kitchen  Neurological Examination: Protective sensation intact with 10 gram monofilament b/l LE. Vibratory sensation intact b/l LE.   Musculoskeletal Examination: Normal muscle strength 5/5 to all lower extremity muscle groups bilaterally. No pain, crepitus or joint limitation noted with ROM b/l LE. No gross bony pedal deformities b/l. Patient ambulates independently without  assistive aids.  Assessment/Plan: 1. Pain due to onychomycosis of toenails of both feet   2. Coagulation defect [facility])     Patient was evaluated and treated. All patient's and/or POA's questions/concerns addressed on today's visit. Toenails 1-5 debrided in length and girth without incident. Continue soft, supportive shoe gear daily. Report any pedal injuries to medical professional. Call office if there are any questions/concerns. -Patient/POA to call should there be question/concern in the interim.   Return in about 4 months (around 08/29/2023).  [name], [name]

## 2023-06-05 ENCOUNTER — Other Ambulatory Visit: Payer: Self-pay

## 2023-06-05 ENCOUNTER — Ambulatory Visit
Admission: EM | Admit: 2023-06-05 | Discharge: 2023-06-05 | Disposition: A | Discharge status: Home / Self Care | Payer: No Typology Code available for payment source | Attending: Emergency Medicine | Admitting: Emergency Medicine

## 2023-06-05 ENCOUNTER — Inpatient Hospital Stay
Admission: EM | Admit: 2023-06-05 | Discharge: 2023-06-09 | DRG: 872 | Disposition: A | Discharge status: Home Health | Payer: No Typology Code available for payment source | Attending: Internal Medicine | Admitting: Internal Medicine

## 2023-06-05 ENCOUNTER — Emergency Department: Payer: No Typology Code available for payment source

## 2023-06-05 DIAGNOSIS — N179 Acute kidney failure, unspecified: Secondary | ICD-10-CM | POA: Diagnosis present

## 2023-06-05 DIAGNOSIS — N401 Enlarged prostate with lower urinary tract symptoms: Secondary | ICD-10-CM | POA: Diagnosis present

## 2023-06-05 DIAGNOSIS — Z7951 Long term (current) use of inhaled steroids: Secondary | ICD-10-CM | POA: Diagnosis not present

## 2023-06-05 DIAGNOSIS — Z955 Presence of coronary angioplasty implant and graft: Secondary | ICD-10-CM

## 2023-06-05 DIAGNOSIS — F32A Depression, unspecified: Secondary | ICD-10-CM | POA: Diagnosis present

## 2023-06-05 DIAGNOSIS — Z7982 Long term (current) use of aspirin: Secondary | ICD-10-CM

## 2023-06-05 DIAGNOSIS — L03113 Cellulitis of right upper limb: Secondary | ICD-10-CM | POA: Diagnosis not present

## 2023-06-05 DIAGNOSIS — I2510 Atherosclerotic heart disease of native coronary artery without angina pectoris: Secondary | ICD-10-CM | POA: Diagnosis present

## 2023-06-05 DIAGNOSIS — Z800 Family history of malignant neoplasm of digestive organs: Secondary | ICD-10-CM

## 2023-06-05 DIAGNOSIS — Z833 Family history of diabetes mellitus: Secondary | ICD-10-CM

## 2023-06-05 DIAGNOSIS — Z7901 Long term (current) use of anticoagulants: Secondary | ICD-10-CM

## 2023-06-05 DIAGNOSIS — C9110 Chronic lymphocytic leukemia of B-cell type not having achieved remission: Secondary | ICD-10-CM | POA: Diagnosis not present

## 2023-06-05 DIAGNOSIS — N1831 Chronic kidney disease, stage 3a: Secondary | ICD-10-CM | POA: Diagnosis present

## 2023-06-05 DIAGNOSIS — E785 Hyperlipidemia, unspecified: Secondary | ICD-10-CM | POA: Diagnosis present

## 2023-06-05 DIAGNOSIS — J449 Chronic obstructive pulmonary disease, unspecified: Secondary | ICD-10-CM | POA: Diagnosis present

## 2023-06-05 DIAGNOSIS — Z66 Do not resuscitate: Secondary | ICD-10-CM | POA: Diagnosis present

## 2023-06-05 DIAGNOSIS — A419 Sepsis, unspecified organism: Secondary | ICD-10-CM | POA: Diagnosis present

## 2023-06-05 DIAGNOSIS — F0393 Unspecified dementia, unspecified severity, with mood disturbance: Secondary | ICD-10-CM | POA: Diagnosis present

## 2023-06-05 DIAGNOSIS — K5900 Constipation, unspecified: Secondary | ICD-10-CM | POA: Diagnosis not present

## 2023-06-05 DIAGNOSIS — E872 Acidosis, unspecified: Secondary | ICD-10-CM | POA: Diagnosis present

## 2023-06-05 DIAGNOSIS — I10 Essential (primary) hypertension: Secondary | ICD-10-CM | POA: Diagnosis present

## 2023-06-05 DIAGNOSIS — I517 Cardiomegaly: Secondary | ICD-10-CM | POA: Diagnosis present

## 2023-06-05 DIAGNOSIS — C9111 Chronic lymphocytic leukemia of B-cell type in remission: Secondary | ICD-10-CM | POA: Diagnosis present

## 2023-06-05 DIAGNOSIS — S51811S Laceration without foreign body of right forearm, sequela: Secondary | ICD-10-CM | POA: Diagnosis not present

## 2023-06-05 DIAGNOSIS — Z79899 Other long term (current) drug therapy: Secondary | ICD-10-CM

## 2023-06-05 DIAGNOSIS — R197 Diarrhea, unspecified: Secondary | ICD-10-CM | POA: Diagnosis present

## 2023-06-05 DIAGNOSIS — Z8249 Family history of ischemic heart disease and other diseases of the circulatory system: Secondary | ICD-10-CM

## 2023-06-05 DIAGNOSIS — Z888 Allergy status to other drugs, medicaments and biological substances status: Secondary | ICD-10-CM

## 2023-06-05 DIAGNOSIS — I482 Chronic atrial fibrillation, unspecified: Secondary | ICD-10-CM | POA: Diagnosis present

## 2023-06-05 DIAGNOSIS — I129 Hypertensive chronic kidney disease with stage 1 through stage 4 chronic kidney disease, or unspecified chronic kidney disease: Secondary | ICD-10-CM | POA: Diagnosis present

## 2023-06-05 DIAGNOSIS — D696 Thrombocytopenia, unspecified: Secondary | ICD-10-CM | POA: Diagnosis present

## 2023-06-05 DIAGNOSIS — M7989 Other specified soft tissue disorders: Secondary | ICD-10-CM | POA: Diagnosis not present

## 2023-06-05 DIAGNOSIS — W228XXS Striking against or struck by other objects, sequela: Secondary | ICD-10-CM

## 2023-06-05 DIAGNOSIS — Z9185 Personal history of military service: Secondary | ICD-10-CM

## 2023-06-05 DIAGNOSIS — Z87891 Personal history of nicotine dependence: Secondary | ICD-10-CM

## 2023-06-05 LAB — COMPREHENSIVE METABOLIC PANEL
ALT: 14 U/L (ref 0–44)
AST: 15 U/L (ref 15–41)
Albumin: 4.5 g/dL (ref 3.5–5.0)
Alkaline Phosphatase: 41 U/L (ref 38–126)
Anion gap: 11 (ref 5–15)
BUN: 31 mg/dL — ABNORMAL HIGH (ref 8–23)
CO2: 26 mmol/L (ref 22–32)
Calcium: 9.4 mg/dL (ref 8.9–10.3)
Chloride: 100 mmol/L (ref 98–111)
Creatinine, Ser: 1.71 mg/dL — ABNORMAL HIGH (ref 0.61–1.24)
GFR, Estimated: 40 mL/min — ABNORMAL LOW (ref >60)
Glucose, Bld: 127 mg/dL — ABNORMAL HIGH (ref 70–99)
Potassium: 3.9 mmol/L (ref 3.5–5.1)
Sodium: 137 mmol/L (ref 135–145)
Total Bilirubin: 1 mg/dL (ref 0.3–1.2)
Total Protein: 8.4 g/dL — ABNORMAL HIGH (ref 6.5–8.1)

## 2023-06-05 LAB — CBC WITH DIFFERENTIAL/PLATELET
Abs Immature Granulocytes: 0.1 10*3/uL — ABNORMAL HIGH (ref 0.00–0.07)
Basophils Absolute: 0 10*3/uL (ref 0.0–0.1)
Basophils Relative: 0 %
Eosinophils Absolute: 0 10*3/uL (ref 0.0–0.5)
Eosinophils Relative: 0 %
HCT: 43 % (ref 39.0–52.0)
Hemoglobin: 14.4 g/dL (ref 13.0–17.0)
Immature Granulocytes: 1 %
Lymphocytes Relative: 41 %
Lymphs Abs: 6.7 10*3/uL — ABNORMAL HIGH (ref 0.7–4.0)
MCH: 29.7 pg (ref 26.0–34.0)
MCHC: 33.5 g/dL (ref 30.0–36.0)
MCV: 88.7 fL (ref 80.0–100.0)
Monocytes Absolute: 1 10*3/uL (ref 0.1–1.0)
Monocytes Relative: 6 %
Neutro Abs: 8.6 10*3/uL — ABNORMAL HIGH (ref 1.7–7.7)
Neutrophils Relative %: 52 %
Platelets: 101 10*3/uL — ABNORMAL LOW (ref 150–400)
RBC: 4.85 MIL/uL (ref 4.22–5.81)
RDW: 13.8 % (ref 11.5–15.5)
Smear Review: NORMAL
WBC: 16.4 10*3/uL — ABNORMAL HIGH (ref 4.0–10.5)
nRBC: 0 % (ref 0.0–0.2)

## 2023-06-05 LAB — PROTIME-INR
INR: 1.7 — ABNORMAL HIGH (ref 0.8–1.2)
Prothrombin Time: 20.4 seconds — ABNORMAL HIGH (ref 11.4–15.2)

## 2023-06-05 LAB — PROCALCITONIN: Procalcitonin: 1.85 ng/mL

## 2023-06-05 LAB — LACTIC ACID, PLASMA
Lactic Acid, Venous: 1.5 mmol/L (ref 0.5–1.9)
Lactic Acid, Venous: 1.3 mmol/L (ref 0.5–1.9)

## 2023-06-05 LAB — SEDIMENTATION RATE: Sed Rate: 4 mm/hr (ref 0–20)

## 2023-06-05 LAB — LACTATE DEHYDROGENASE: LDH: 151 U/L (ref 98–192)

## 2023-06-05 MED ORDER — SODIUM CHLORIDE 0.9 % IV BOLUS
500.0000 mL | Freq: Once | INTRAVENOUS | Status: AC
  Administered 2023-06-05: 500 mL via INTRAVENOUS

## 2023-06-05 MED ORDER — FENTANYL CITRATE PF 50 MCG/ML IJ SOSY
50.0000 ug | PREFILLED_SYRINGE | Freq: Once | INTRAMUSCULAR | Status: AC
  Administered 2023-06-05: 50 ug via INTRAVENOUS
  Filled 2023-06-05: qty 1

## 2023-06-05 MED ORDER — SODIUM CHLORIDE 0.9 % IV SOLN: INTRAVENOUS | Status: DC

## 2023-06-05 MED ORDER — HYDRALAZINE HCL 20 MG/ML IJ SOLN
5.0000 mg | INTRAMUSCULAR | Status: DC | PRN
  Administered 2023-06-07: 5 mg via INTRAVENOUS
  Filled 2023-06-05: qty 1

## 2023-06-05 MED ORDER — ACETAMINOPHEN 325 MG PO TABS
650.0000 mg | ORAL_TABLET | Freq: Four times a day (QID) | ORAL | Status: DC | PRN
  Administered 2023-06-08 – 2023-06-09 (×4): 650 mg via ORAL
  Filled 2023-06-05 (×4): qty 2

## 2023-06-05 MED ORDER — OXYCODONE-ACETAMINOPHEN 5-325 MG PO TABS
1.0000 | ORAL_TABLET | ORAL | Status: DC | PRN
  Administered 2023-06-05 – 2023-06-06 (×2): 1 via ORAL
  Filled 2023-06-05 (×2): qty 1

## 2023-06-05 MED ORDER — VANCOMYCIN HCL IN DEXTROSE 1-5 GM/200ML-% IV SOLN
1000.0000 mg | INTRAVENOUS | Status: DC
  Administered 2023-06-06 – 2023-06-07 (×2): 1000 mg via INTRAVENOUS
  Filled 2023-06-05 (×2): qty 200

## 2023-06-05 MED ORDER — DM-GUAIFENESIN ER 30-600 MG PO TB12
1.0000 | ORAL_TABLET | Freq: Two times a day (BID) | ORAL | Status: DC | PRN
  Administered 2023-06-06: 1 via ORAL
  Filled 2023-06-05: qty 1

## 2023-06-05 MED ORDER — ONDANSETRON HCL 4 MG/2ML IJ SOLN
4.0000 mg | Freq: Three times a day (TID) | INTRAMUSCULAR | Status: DC | PRN
  Administered 2023-06-08: 4 mg via INTRAVENOUS
  Filled 2023-06-05: qty 2

## 2023-06-05 MED ORDER — VANCOMYCIN HCL IN DEXTROSE 1-5 GM/200ML-% IV SOLN
1000.0000 mg | Freq: Once | INTRAVENOUS | Status: AC
  Administered 2023-06-05: 1000 mg via INTRAVENOUS
  Filled 2023-06-05: qty 200

## 2023-06-05 MED ORDER — HALOPERIDOL LACTATE 5 MG/ML IJ SOLN
1.0000 mg | Freq: Once | INTRAMUSCULAR | Status: AC
  Administered 2023-06-05: 1 mg via INTRAVENOUS
  Filled 2023-06-05: qty 1

## 2023-06-05 MED ORDER — SODIUM CHLORIDE 0.9 % IV SOLN
1.0000 g | INTRAVENOUS | Status: DC
  Administered 2023-06-06 – 2023-06-08 (×4): 1 g via INTRAVENOUS
  Filled 2023-06-05 (×4): qty 10

## 2023-06-05 MED ORDER — ALBUTEROL SULFATE (2.5 MG/3ML) 0.083% IN NEBU
2.5000 mg | INHALATION_SOLUTION | RESPIRATORY_TRACT | Status: DC | PRN
  Administered 2023-06-07: 2.5 mg via RESPIRATORY_TRACT
  Filled 2023-06-05: qty 3

## 2023-06-05 MED ORDER — TRAZODONE HCL 100 MG PO TABS
100.0000 mg | ORAL_TABLET | Freq: Every day | ORAL | Status: DC
  Administered 2023-06-05 – 2023-06-08 (×4): 100 mg via ORAL
  Filled 2023-06-05 (×4): qty 1

## 2023-06-05 NOTE — H&P (Signed)
History and Physical    Victor Ramos NUU:[id number] DOB: 05/24/1941 DOA: 06/05/2023  Referring MD/NP/PA:   PCP: [facility]   Patient coming from:  The patient is coming from home.     Chief Complaint: right forearm pain  HPI: Victor Ramos is a 82 y.o. male with medical history significant of dementia, a fib on Eliquis, HTN, HLD, CLL in remission, COPD, CAD, GIB, depression, BPH, CKD-3a, who presents with right forearm pain.  Patient has hx of dementia, and is unable to provide accurate medical history. Per his wife at bedside,  pt sustained a small laceration to his right medial forearm on the kitchen door 1 week ago, but was fine up until last night when he started having worsening pain in the right forearm, swelling, erythema and redness. It has been progressively worsening, involving right forearm and dorsal side of right hand.  Patient has low-grade fever, no chills. No chest pain, cough, shortness of breath. Pt has diarrhea today with loose stool bowel movement.  No nausea, vomiting, abdominal pain.  No symptoms of UTI. Per his wife, patient's mental status is at his baseline. [facility]  [facility].   Data reviewed independently and ED Course: pt was found to have WBC 16.4, platelet 101, INR 1.7, worsened renal function with creatinine of 1.71, BUN 31 and GFR 40 (most recent baseline creatinine 1.12 on 07/23/2018).  Temperature 99.4, blood pressure 159/96, heart rate 93-99, RR 23 --> 19, oxygen saturation 96% on room air.  Chest x-ray showed mild cardiomegaly without infiltration.  Venous Doppler of right upper extremity is negative for DVT.  Patient is admitted to telemetry bed as inpatient.   EKG:  Not done in ED, will get one.      Review of Systems: Could not be reviewed accurately due to dementia   Allergy:  Allergies  Allergen Reactions   Atorvastatin     Other Reaction(s): Delirium, Memory impairment, Delirium,  Memory impairment    Past Medical History:  Diagnosis Date   Atrial fibrillation (HCC)    BPH (benign prostatic hyperplasia)    COPD (chronic obstructive pulmonary disease) (HCC)    Coronary artery disease    Dementia (HCC)    Hypertension    Lymphoma (HCC)     Past Surgical History:  Procedure Laterality Date   BOWEL RESECTION  07/13/2018   Procedure: SMALL BOWEL RESECTION;  Surgeon: Henrene Dodge, MD;  Location: [facility] ORS;  Service: General;;   CORONARY ANGIOPLASTY WITH STENT PLACEMENT     ESOPHAGOGASTRODUODENOSCOPY (EGD) WITH PROPOFOL N/A 07/24/2018   Procedure: ESOPHAGOGASTRODUODENOSCOPY (EGD) WITH PROPOFOL;  Surgeon: Wyline Mood, MD;  Location: [facility] ENDOSCOPY;  Service: Gastroenterology;  Laterality: N/A;   HERNIA REPAIR     INGUINAL HERNIA REPAIR Right 07/13/2018   Procedure: HERNIA REPAIR INGUINAL ADULT;  Surgeon: Henrene Dodge, MD;  Location: [facility] ORS;  Service: General;  Laterality: Right;   JOINT REPLACEMENT      Social History:  reports that he quit smoking about 26 years ago. His smoking use included cigarettes. He started smoking about 66 years ago. He has a 60 pack-year smoking history. He has never used smokeless tobacco. He reports that he does not currently use alcohol after a past usage of about 4.0 standard drinks of alcohol per week. He reports that he does not use drugs.  Family History:  Family History  Problem Relation Age of Onset   Stomach cancer  Mother    Heart attack Father    Diabetes Father    Cancer Brother      Prior to Admission medications   Medication Sig Start Date End Date Taking? Authorizing Provider  albuterol (VENTOLIN HFA) 108 (90 Base) MCG/ACT inhaler Inhale into the lungs every 6 (six) hours as needed for wheezing or shortness of breath.    [provider]  amLODipine (NORVASC) 10 MG tablet Take 10 mg by mouth daily.    [provider]  amoxicillin-clavulanate (AUGMENTIN) 875-125 MG tablet Take 1 tablet by mouth every  12 (twelve) hours. 11/03/22   White, Elita Boone, NP  apixaban (ELIQUIS) 5 MG TABS tablet Take 5 mg by mouth.    [provider]  aspirin EC 81 MG tablet Take 81 mg by mouth daily.    [provider]  benzonatate (TESSALON) 100 MG capsule Take 1 capsule (100 mg total) by mouth every 8 (eight) hours. 11/03/22   Valinda Hoar, NP  budesonide-formoterol (SYMBICORT) 160-4.5 MCG/ACT inhaler Inhale 2 puffs into the lungs 2 (two) times daily.    [provider]  carvedilol (COREG) 6.25 MG tablet Take 6.25 mg by mouth 2 (two) times daily with a meal.    [provider]  docusate sodium (COLACE) 100 MG capsule Take 100 mg by mouth 2 (two) times daily.    [provider]  finasteride (PROSCAR) 5 MG tablet Take 5 mg by mouth daily.    [provider]  gentamicin cream (GARAMYCIN) 0.1 % Apply 1 application topically 2 (two) times daily. 03/28/20   Felecia Shelling, [name]  hydrALAZINE (APRESOLINE) 25 MG tablet Take by mouth. 01/24/23   [provider]  lisinopril (ZESTRIL) 40 MG tablet Take by mouth. 01/24/23   [provider]  Melatonin 5 MG TABS Take 1 tablet by mouth at bedtime.     [provider]  metoprolol succinate (TOPROL-XL) 25 MG 24 hr tablet Take 25 mg by mouth 2 (two) times daily. Patient not taking: Reported on 04/28/2023    [provider]  NIFEdipine (ADALAT CC) 60 MG 24 hr tablet Take by mouth. 05/30/23   [provider]  senna-docusate (SENOKOT-S) 8.6-50 MG tablet Take 1 tablet by mouth daily.    [provider]  sertraline (ZOLOFT) 50 MG tablet Take 50 mg by mouth daily.    [provider]  tamsulosin (FLOMAX) 0.4 MG CAPS capsule Take 0.4 mg by mouth daily.     [provider]  tiotropium (SPIRIVA) 18 MCG inhalation capsule Place 18 mcg into inhaler and inhale daily.    [provider]  traZODone (DESYREL) 50 MG tablet Take 50 mg by mouth at bedtime as needed for sleep.     [provider]  vitamin B-12 (CYANOCOBALAMIN) 1000 MCG tablet Take 1,000 mcg by mouth daily.    [provider]  Vitamin D3 (VITAMIN D) 25 MCG tablet Take 1,000 Units by mouth daily.    [provider]    Physical Exam: Vitals:   06/05/23 1938 06/05/23 1956 06/05/23 2015 06/05/23 2030  BP: 130/66  (!) 159/96   Pulse: 93 87 88 93  Resp: (!) 22 (!) 23 17 19   Temp: 99.4 F (37.4 C)     TempSrc: Oral     SpO2: 98% 98% 98% 96%  Weight:      Height:       General: Not in acute distress HEENT:       Eyes: PERRL,  EOMI, no jaundice       ENT: No discharge from the ears and nose, no pharynx injection, no tonsillar enlargement.        Neck: No JVD, no bruit, no mass felt. Heme: No neck lymph node enlargement. Cardiac: S1/S2, No murmurs, No gallops or rubs. Respiratory: No rales, wheezing, rhonchi or rubs. GI: Soft, nondistended, nontender, no organomegaly, BS present. GU: No hematuria Ext: No pitting leg edema bilaterally. 1+DP/PT pulse bilaterally.  Has erythema, tenderness, warmth, swelling in right forearm and dorsal side of right hand       Musculoskeletal: No joint deformities, No joint redness or warmth, no limitation of ROM in spin. Skin: No rashes.  Neuro: Alert, following command, cranial nerves II-XII grossly intact, moves all extremities normally.  Psych: Patient is not psychotic, no suicidal or hemocidal ideation.  Labs on Admission: I have personally reviewed following labs and imaging studies  CBC: Recent Labs  Lab 06/05/23 1952  WBC 16.4*  NEUTROABS 8.6*  HGB 14.4  HCT 43.0  MCV 88.7  PLT 101*   Basic Metabolic Panel: Recent Labs  Lab 06/05/23 1952  NA 137  K 3.9  CL 100  CO2 26  GLUCOSE 127*  BUN 31*  CREATININE 1.71*  CALCIUM 9.4   GFR: Estimated Creatinine Clearance: 35 mL/min (A) (by C-G formula based on SCr of 1.71 mg/dL (H)). Liver Function Tests: Recent Labs  Lab 06/05/23 1952  AST 15  ALT 14  ALKPHOS 41   BILITOT 1.0  PROT 8.4*  ALBUMIN 4.5   No results for input(s): "LIPASE", "AMYLASE" in the last 168 hours. No results for input(s): "AMMONIA" in the last 168 hours. Coagulation Profile: Recent Labs  Lab 06/05/23 1952  INR 1.7*   Cardiac Enzymes: No results for input(s): "CKTOTAL", "CKMB", "CKMBINDEX", "TROPONINI" in the last 168 hours. BNP (last 3 results) No results for input(s): "PROBNP" in the last 8760 hours. HbA1C: No results for input(s): "HGBA1C" in the last 72 hours. CBG: No results for input(s): "GLUCAP" in the last 168 hours. Lipid Profile: No results for input(s): "CHOL", "HDL", "LDLCALC", "TRIG", "CHOLHDL", "LDLDIRECT" in the last 72 hours. Thyroid Function Tests: No results for input(s): "TSH", "T4TOTAL", "FREET4", "T3FREE", "THYROIDAB" in the last 72 hours. Anemia Panel: No results for input(s): "VITAMINB12", "FOLATE", "FERRITIN", "TIBC", "IRON", "RETICCTPCT" in the last 72 hours. Urine analysis:    Component Value Date/Time   COLORURINE YELLOW (A) 07/13/2018 1736   APPEARANCEUR CLEAR (A) 07/13/2018 1736   LABSPEC 1.011 07/13/2018 1736   PHURINE 5.0 07/13/2018 1736   GLUCOSEU NEGATIVE 07/13/2018 1736   HGBUR MODERATE (A) 07/13/2018 1736   BILIRUBINUR NEGATIVE 07/13/2018 1736   KETONESUR NEGATIVE 07/13/2018 1736   PROTEINUR 30 (A) 07/13/2018 1736   NITRITE NEGATIVE 07/13/2018 1736   LEUKOCYTESUR TRACE (A) 07/13/2018 1736   Sepsis Labs: @LABRCNTIP (procalcitonin:4,lacticidven:4) )No results found for this or any previous visit (from the past 240 hour(s)).   Radiological Exams on Admission: US Venous Img Upper Uni Right(DVT)  Result Date: 06/05/2023 CLINICAL DATA:  16109 Swelling 60454. Left lower lobe malignancy. Pain, edema, anticoagulation therapy, numbness, color changes EXAM: Right UPPER EXTREMITY VENOUS DOPPLER ULTRASOUND TECHNIQUE: Gray-scale sonography with graded compression, as well as color Doppler and duplex ultrasound were performed to evaluate  the upper extremity deep venous system from the level of the subclavian vein and including the jugular, axillary, basilic, radial, ulnar and upper cephalic vein. Spectral Doppler was utilized to evaluate flow at rest and with distal augmentation maneuvers. COMPARISON:  None Available. FINDINGS: Contralateral Subclavian Vein: Respiratory phasicity is normal and symmetric with the symptomatic side. No evidence of thrombus. Normal compressibility. Internal Jugular Vein: No evidence of thrombus. Normal compressibility, respiratory phasicity and response to augmentation. Subclavian Vein: No evidence of thrombus. Normal compressibility, respiratory phasicity and response to augmentation. Axillary Vein: No evidence of thrombus. Normal compressibility, respiratory phasicity and response to augmentation. Cephalic Vein: No evidence of thrombus. Normal compressibility, respiratory phasicity and response to augmentation. Basilic Vein: No evidence of thrombus. Normal compressibility, respiratory phasicity and response to augmentation. Brachial Veins: No evidence of thrombus. Normal compressibility, respiratory phasicity and response to augmentation. Radial Veins: No evidence of thrombus. Normal compressibility, respiratory phasicity and response to augmentation. Ulnar Veins: No evidence of thrombus. Normal compressibility, respiratory phasicity and response to augmentation. Venous Reflux:  None visualized. Other Findings:  None visualized. IMPRESSION: No evidence of DVT within the right upper extremity. Electronically Signed   By: Tish Frederickson M.D.   On: 06/05/2023 21:10   DG Chest 1 View  Result Date: 06/05/2023 CLINICAL DATA:  Sepsis EXAM: CHEST  1 VIEW COMPARISON:  09/24/2005 FINDINGS: Mild cardiomegaly. Aortic atherosclerosis. No focal airspace consolidation, pleural effusion, or pneumothorax. IMPRESSION: Mild cardiomegaly. No acute cardiopulmonary findings. Electronically Signed   By: Duanne Guess D.O.   On:  06/05/2023 20:28      Assessment/Plan Principal Problem:   Cellulitis of right upper extremity Active Problems:   Sepsis (HCC)   CAD (coronary artery disease)   HTN (hypertension)   Chronic a-fib (HCC)   COPD (chronic obstructive pulmonary disease) (HCC)   Thrombocytopenia (HCC)   Benign localized prostatic hyperplasia with lower urinary tract symptoms (LUTS)   Acute renal failure superimposed on stage 3a chronic kidney disease (HCC)   Depression   CLL (chronic lymphocytic leukemia) (HCC)   Diarrhea   Assessment and Plan:   Sepsis due to cellulitis of right upper extremity: pt meets criteria for sepsis with WBC 16.4, heart rate up to 99, RR up to 23.  Lactic acid is normal at 1.5 --> 5.3.  Patient has worsening renal function.  - will admit to tele bed as inpatient - Empiric antimicrobial treatment with vancomycin and Rocephin - PRN Zofran for nausea, tylenol and Percocet for pain - Blood cultures x 2  - ESR and CRP - IVF: 500 ml of NS bolus in ED, followed by 75 cc/h  CAD (coronary artery disease): no CP -ASA  HTN (hypertension): -Hold blood pressure due to sepsis and risk of developing hypotension, except for beta-blocker which is for rate control of A-fib -IV hydralazine as needed  Chronic a-fib (HCC):  HR 90s -Eliquis -Beta-blocker: coreg  COPD (chronic obstructive pulmonary disease) (HCC): stable -Bronchodilators  Thrombocytopenia (HCC): Platelets are 101, unclear etiology, may be due to infection -Check LDH --> normal 150 -Peripheral smear  Benign localized prostatic hyperplasia with lower urinary tract symptoms (LUTS) -Proscar and Flomax  Acute renal failure superimposed on stage 3a chronic kidney disease (HCC): -Hold lisinopril -IV hydralazine as needed  Depression -Zoloft  CLL (chronic lymphocytic leukemia) (HCC): In remission -Patient is following up with Rad Onc in [facility]  Diarrhea -Check C. difficile      DVT ppx: on  Eliquis  Code Status: DNR per his wife  Family Communication:    Yes, patient's wife   at bed side.      Disposition Plan:  Anticipate discharge back to previous environment  Consults called:  none  Admission status and Level of care:  Telemetry Medical:    as inpt       Dispo: The patient is from: Home              Anticipated d/c is to: Home              Anticipated d/c date is: 2 days              Patient currently is not medically stable to d/c.    Severity of Illness:  The appropriate patient status for this patient is INPATIENT. Inpatient status is judged to be reasonable and necessary in order to provide the required intensity of service to ensure the patient's safety. The patient's presenting symptoms, physical exam findings, and initial radiographic and laboratory data in the context of their chronic comorbidities is felt to place them at high risk for further clinical deterioration. Furthermore, it is not anticipated that the patient will be medically stable for discharge from the hospital within 2 midnights of admission.   * I certify that at the point of admission it is my clinical judgment that the patient will require inpatient hospital care spanning beyond 2 midnights from the point of admission due to high intensity of service, high risk for further deterioration and high frequency of surveillance required.*       Date of Service 06/05/2023    Lorretta Harp [facility]   If 7PM-7AM, please contact night-coverage [email or web address] 06/05/2023, 10:47 PM

## 2023-06-05 NOTE — Progress Notes (Signed)
Pharmacy Antibiotic Note  Victor Ramos is a 82 y.o. male admitted on 06/05/2023 with cellulitis.  Pharmacy has been consulted for Vancomycin dosing.  Plan: Pt given Vancomycin 1000 mg once. Vancomycin 1000 mg IV Q 24 hrs. Goal AUC 400-550. Expected AUC: 508.5 SCr used: 1.71  Pharmacy will continue to follow and will adjust abx dosing whenever warranted.  Temp (24hrs), Avg:98.9 F (37.2 C), Min:98.4 F (36.9 C), Max:99.4 F (37.4 C)   Recent Labs  Lab 06/05/23 1952 06/05/23 2130  WBC 16.4*  --   CREATININE 1.71*  --   LATICACIDVEN 1.5 1.3    Estimated Creatinine Clearance: 35 mL/min (A) (by C-G formula based on SCr of 1.71 mg/dL (H)).    Allergies  Allergen Reactions   Atorvastatin     Other Reaction(s): Delirium, Memory impairment, Delirium, Memory impairment    Antimicrobials this admission: 8/8 Vancomycin >>  8/8 Ceftriaxone >>   Microbiology results: 8/8 BCx: Pending  Thank you for allowing pharmacy to be a part of this patient's care.  Otelia Sergeant, PharmD, [facility] 06/05/2023 10:17 PM

## 2023-06-05 NOTE — ED Provider Notes (Signed)
HPI  SUBJECTIVE:  Victor Ramos is a right-handed 82 y.o. male who presents with right forearm pain, swelling, erythema, increased temperature starting last night.  Wife states that the erythema and swelling are getting worse.  He sustained a laceration to his forearm on the kitchen door 1 week ago, but was fine up until last night.  She is not sure if the erythema is moving proximally.  Patient reports hand numbness, pain with all wrist and hand range of motion.  No fevers, body aches, purulent drainage from the laceration.  Wife has been putting ice for 15 minutes every hour on his arm initially, but states that it is no longer helping.  She has also been applying antibacterial ointment.  No aggravating or alleviating factors.  Patient has a past medical history of dementia, atrial fibrillation on Eliquis, hypertension, CLL in remission, coronary artery disease, COPD.  No history of diabetes, MRSA, DVT, chronic kidney disease.  PCP: [facility]  All history obtained from wife.  Past Medical History:  Diagnosis Date   Atrial fibrillation (HCC)    BPH (benign prostatic hyperplasia)    COPD (chronic obstructive pulmonary disease) (HCC)    Coronary artery disease    Dementia (HCC)    Hypertension    Lymphoma (HCC)     Past Surgical History:  Procedure Laterality Date   BOWEL RESECTION  07/13/2018   Procedure: SMALL BOWEL RESECTION;  Surgeon: Henrene Dodge, MD;  Location: [facility] ORS;  Service: General;;   CORONARY ANGIOPLASTY WITH STENT PLACEMENT     ESOPHAGOGASTRODUODENOSCOPY (EGD) WITH PROPOFOL N/A 07/24/2018   Procedure: ESOPHAGOGASTRODUODENOSCOPY (EGD) WITH PROPOFOL;  Surgeon: Wyline Mood, MD;  Location: [facility] ENDOSCOPY;  Service: Gastroenterology;  Laterality: N/A;   HERNIA REPAIR     INGUINAL HERNIA REPAIR Right 07/13/2018   Procedure: HERNIA REPAIR INGUINAL ADULT;  Surgeon: Henrene Dodge, MD;  Location: [facility] ORS;  Service: General;  Laterality: Right;   JOINT REPLACEMENT       Family History  Problem Relation Age of Onset   Stomach cancer Mother    Heart attack Father    Diabetes Father    Cancer Brother     Social History   Tobacco Use   Smoking status: Former    Current packs/day: 0.00    Average packs/day: 1.5 packs/day for 40.0 years (60.0 ttl pk-yrs)    Types: Cigarettes    Start date: 07/30/1956    Quit date: 07/30/1996    Years since quitting: 26.8   Smokeless tobacco: Never  Vaping Use   Vaping status: Never Used  Substance Use Topics   Alcohol use: Not Currently    Alcohol/week: 4.0 standard drinks of alcohol    Types: 4 Shots of liquor per week   Drug use: No    No current facility-administered medications for this encounter.  Current Outpatient Medications:    albuterol (VENTOLIN HFA) 108 (90 Base) MCG/ACT inhaler, Inhale into the lungs every 6 (six) hours as needed for wheezing or shortness of breath., Disp: , Rfl:    carvedilol (COREG) 6.25 MG tablet, Take 6.25 mg by mouth 2 (two) times daily with a meal., Disp: , Rfl:    finasteride (PROSCAR) 5 MG tablet, Take 5 mg by mouth daily., Disp: , Rfl:    hydrALAZINE (APRESOLINE) 25 MG tablet, Take by mouth., Disp: , Rfl:    lisinopril (ZESTRIL) 40 MG tablet, Take by mouth., Disp: , Rfl:    Melatonin 5 MG TABS, Take 1 tablet by mouth at  bedtime. , Disp: , Rfl:    NIFEdipine (ADALAT CC) 60 MG 24 hr tablet, Take by mouth., Disp: , Rfl:    sertraline (ZOLOFT) 50 MG tablet, Take 50 mg by mouth daily., Disp: , Rfl:    tamsulosin (FLOMAX) 0.4 MG CAPS capsule, Take 0.4 mg by mouth daily. , Disp: , Rfl:    tiotropium (SPIRIVA) 18 MCG inhalation capsule, Place 18 mcg into inhaler and inhale daily., Disp: , Rfl:    traZODone (DESYREL) 50 MG tablet, Take 50 mg by mouth at bedtime as needed for sleep., Disp: , Rfl:    amLODipine (NORVASC) 10 MG tablet, Take 10 mg by mouth daily., Disp: , Rfl:    amoxicillin-clavulanate (AUGMENTIN) 875-125 MG tablet, Take 1 tablet by mouth every 12 (twelve) hours.,  Disp: 14 tablet, Rfl: 0   apixaban (ELIQUIS) 5 MG TABS tablet, Take 5 mg by mouth., Disp: , Rfl:    aspirin EC 81 MG tablet, Take 81 mg by mouth daily., Disp: , Rfl:    benzonatate (TESSALON) 100 MG capsule, Take 1 capsule (100 mg total) by mouth every 8 (eight) hours., Disp: 21 capsule, Rfl: 0   budesonide-formoterol (SYMBICORT) 160-4.5 MCG/ACT inhaler, Inhale 2 puffs into the lungs 2 (two) times daily., Disp: , Rfl:    docusate sodium (COLACE) 100 MG capsule, Take 100 mg by mouth 2 (two) times daily., Disp: , Rfl:    gentamicin cream (GARAMYCIN) 0.1 %, Apply 1 application topically 2 (two) times daily., Disp: 15 g, Rfl: 1   metoprolol succinate (TOPROL-XL) 25 MG 24 hr tablet, Take 25 mg by mouth 2 (two) times daily. (Patient not taking: Reported on 04/28/2023), Disp: , Rfl:    senna-docusate (SENOKOT-S) 8.6-50 MG tablet, Take 1 tablet by mouth daily., Disp: , Rfl:    vitamin B-12 (CYANOCOBALAMIN) 1000 MCG tablet, Take 1,000 mcg by mouth daily., Disp: , Rfl:    Vitamin D3 (VITAMIN D) 25 MCG tablet, Take 1,000 Units by mouth daily., Disp: , Rfl:   Allergies  Allergen Reactions   Atorvastatin     Other Reaction(s): Delirium, Memory impairment, Delirium, Memory impairment     ROS  As noted in HPI.   Physical Exam  BP 107/75 (BP Location: Left Arm)   Pulse (!) 59   Temp 98.4 F (36.9 C) (Oral)   Ht 5\' 9"  (1.753 m)   Wt 81.6 kg   SpO2 97%   BMI 26.58 kg/m   Constitutional: Well developed, well nourished, no acute distress Eyes:  EOMI, conjunctiva normal bilaterally HENT: Normocephalic, atraumatic,mucus membranes moist Respiratory: Normal inspiratory effort Cardiovascular: Normal rate GI: nondistended skin: See MSK exam Musculoskeletal: Well demarcated tender area of blanchable erythema, increased temperature extending proximally up to the bicep and all the way down to the right hand.  Right elbow, forearm, hand swollen.  Patient able to flex/extend elbow without problem.  Although  nontender.  Limited range of motion of the wrist and hand due to edema.  RP 2+.  Small superficial laceration to the volar aspect of the forearm.  Marked area of erythema with marker.       Neurologic: Alert & oriented x 3, no focal neuro deficits Psychiatric: Speech and behavior appropriate   ED Course   Medications - No data to display  No orders of the defined types were placed in this encounter.   No results found for this or any previous visit (from the past 24 hour(s)). No results found.  ED Clinical Impression  1. Cellulitis  of right upper extremity   2. Hand swelling   3. Arm swelling      ED Assessment/Plan     Patient presents with an extensive, rapidly developing cellulitis of the right arm that is spreading proximally.  I am concerned that a gram of Rocephin and oral antibiotics are not going to be sufficient, especially given his comorbidities.  I am concerned that he could become septic quickly.  I recommend that he go to the emergency department for further evaluation.  He may need IV antibiotics and admission/observation.  He is stable to go via private vehicle.  Gave report to charge nurse at [facility].  Discussed rationale for transfer to the emergency department with patient and spouse.  They agree to go.  No orders of the defined types were placed in this encounter.     *This clinic note was created using Dragon dictation software. Therefore, there may be occasional mistakes despite careful proofreading.  ?    Domenick Gong, MD 06/05/23 1902

## 2023-06-05 NOTE — ED Notes (Signed)
Patient is being discharged from the Urgent Care and sent to the Emergency Department via Personal Vehicle . Per Dr. Chaney Malling, patient is in need of higher level of care due to Cellulitis. Patient is aware and verbalizes understanding of plan of care.  Vitals:   06/05/23 1809  BP: 107/75  Pulse: (!) 59  Temp: 98.4 F (36.9 C)  SpO2: 97%

## 2023-06-05 NOTE — ED Triage Notes (Signed)
Pt is in a wheelchair  Pt is with his wife  Pt has dementia  Pt c/o right arm discoloration and swelling x2days  Pt has swelling from his mid forearm to the back of his hand and redness moving down his arm from his elbow.   Pt recently cut his arm by hitting it on the door 1 week ago.

## 2023-06-05 NOTE — ED Provider Notes (Signed)
[facility] Provider Note    Event Date/Time   First MD Initiated Contact with Patient 06/05/23 1950     (approximate)  History   Chief Complaint: Wound Infection  HPI  Victor Ramos is a 82 y.o. male with a past medical history of atrial fibrillation on Eliquis, COPD, hypertension, dementia, presents to the emergency department for right upper extremity pain redness and swelling.  According to the wife since last night they have noted right upper extremity pain redness swelling.  No reported temperature although 99.4 in the emergency department.  She states several days ago at least she noted a small cut on the patient's right forearm but did not noticed any other redness or swelling until last night and states that has significantly worsened since then.  Here patient is awake alert no distress.  Is complaining of pain at times.  Physical Exam   Triage Vital Signs: ED Triage Vitals  Encounter Vitals Group     BP 06/05/23 1938 130/66     Systolic BP Percentile --      Diastolic BP Percentile --      Pulse Rate 06/05/23 1938 93     Resp 06/05/23 1938 (!) 22     Temp 06/05/23 1938 99.4 F (37.4 C)     Temp Source 06/05/23 1938 Oral     SpO2 06/05/23 1938 98 %     Weight 06/05/23 1935 180 lb (81.6 kg)     Height 06/05/23 1935 5\' 9"  (1.753 m)     Head Circumference --      Peak Flow --      Pain Score 06/05/23 1935 8     Pain Loc --      Pain Education --      Exclude from Growth Chart --     Most recent vital signs: Vitals:   06/05/23 1938  BP: 130/66  Pulse: 93  Resp: (!) 22  Temp: 99.4 F (37.4 C)  SpO2: 98%    General: Awake, no distress.  CV:  Good peripheral perfusion.  Regular rate and rhythm  Resp:  Normal effort.  Equal breath sounds bilaterally.  Abd:  No distention.  Soft, nontender.  No rebound or guarding. Other:  Moderate erythema with small blistering of the right upper extremity.  Moderate edema.   ED Results /  Procedures / Treatments    RADIOLOGY  Ultrasound negative for DVT.   I have reviewed and interpreted the chest x-ray images no consolidation on my evaluation. Radiologist read the chest x-ray is negative   MEDICATIONS ORDERED IN ED: Medications  vancomycin (VANCOCIN) IVPB 1000 mg/200 mL premix (has no administration in time range)  fentaNYL (SUBLIMAZE) injection 50 mcg (has no administration in time range)  sodium chloride 0.9 % bolus 500 mL (has no administration in time range)     IMPRESSION / MDM / ASSESSMENT AND PLAN / ED COURSE  I reviewed the triage vital signs and the nursing notes.  Patient's presentation is most consistent with acute presentation with potential threat to life or bodily function.  Patient presents to the emergency department for right upper extremity redness pain and swelling.  Clinical exam is most consistent with cellulitis there is small blistering as well as erythema and edema just above the level of the elbow and distal through the hand.  We will check labs we will obtain blood cultures lactic acid will obtain an ultrasound to rule out DVT.  Will start the patient  on IV antibiotics while awaiting results.  Ultrasound negative for DVT.  Lab work shows moderate leukocytosis with white blood cell count of 16,000 again most consistent with cellulitis, chemistry shows no significant findings aside mild renal insufficiency compared to baseline.  Patient receiving IV antibiotics blood cultures have been sent.  Lactate reassuringly 1.3.  Patient will be admitted to the hospital service for ongoing antibiotics for right upper extremity cellulitis.  [facility] but I spoke to the patient and wife they would prefer to stay here and have secondary insurance.  FINAL CLINICAL IMPRESSION(S) / ED DIAGNOSES   Right upper extremity cellulitis  Note:  This document was prepared using Dragon voice recognition software and may include unintentional dictation  errors.   Minna Antis, MD 06/05/23 2224

## 2023-06-05 NOTE — ED Triage Notes (Signed)
Pt R arm is significantly edematous with redness and blisters noted. Pt reports has been developing over the last 2 days. Wife reports that initially started with a small cut on the FA from walking in the door and has continued to worsen. Pt reports the R hand is slightly numb. Pt and spouse deny any fevers at home. Pt is alert and at baseline with hx of dementia. Wound borders marked at UC earlier today.

## 2023-06-05 NOTE — Discharge Instructions (Addendum)
Please go to the emergency department right now.  I am concerned that you have a severe skin infection that could get rapidly worse, leading to sepsis.  Sepsis is a very serious, life-threatening illness.  I am concerned that you will need IV antibiotics that are stronger than what we can give orally.

## 2023-06-06 DIAGNOSIS — L03113 Cellulitis of right upper limb: Secondary | ICD-10-CM | POA: Diagnosis not present

## 2023-06-06 MED ORDER — APIXABAN 5 MG PO TABS
5.0000 mg | ORAL_TABLET | Freq: Two times a day (BID) | ORAL | Status: DC
  Administered 2023-06-06 – 2023-06-09 (×7): 5 mg via ORAL
  Filled 2023-06-06 (×7): qty 1

## 2023-06-06 MED ORDER — TAMSULOSIN HCL 0.4 MG PO CAPS
0.4000 mg | ORAL_CAPSULE | Freq: Every day | ORAL | Status: DC
  Administered 2023-06-06 – 2023-06-09 (×4): 0.4 mg via ORAL
  Filled 2023-06-06 (×4): qty 1

## 2023-06-06 MED ORDER — SERTRALINE HCL 50 MG PO TABS
50.0000 mg | ORAL_TABLET | Freq: Every day | ORAL | Status: DC
  Administered 2023-06-06 – 2023-06-07 (×2): 50 mg via ORAL
  Filled 2023-06-06 (×2): qty 1

## 2023-06-06 MED ORDER — CARVEDILOL 6.25 MG PO TABS
6.2500 mg | ORAL_TABLET | Freq: Two times a day (BID) | ORAL | Status: DC
  Administered 2023-06-06 (×2): 6.25 mg via ORAL
  Filled 2023-06-06 (×2): qty 1

## 2023-06-06 MED ORDER — TIOTROPIUM BROMIDE MONOHYDRATE 18 MCG IN CAPS
18.0000 ug | ORAL_CAPSULE | Freq: Every day | RESPIRATORY_TRACT | Status: DC
  Administered 2023-06-06 – 2023-06-09 (×4): 18 ug via RESPIRATORY_TRACT
  Filled 2023-06-06: qty 5

## 2023-06-06 MED ORDER — TRAZODONE HCL 50 MG PO TABS: 50.0000 mg | ORAL_TABLET | Freq: Every evening | ORAL | Status: DC | PRN

## 2023-06-06 MED ORDER — FINASTERIDE 5 MG PO TABS
5.0000 mg | ORAL_TABLET | Freq: Every day | ORAL | Status: DC
  Administered 2023-06-06 – 2023-06-09 (×4): 5 mg via ORAL
  Filled 2023-06-06 (×4): qty 1

## 2023-06-06 MED ORDER — MOMETASONE FURO-FORMOTEROL FUM 200-5 MCG/ACT IN AERO
2.0000 | INHALATION_SPRAY | Freq: Two times a day (BID) | RESPIRATORY_TRACT | Status: DC
  Administered 2023-06-06 – 2023-06-09 (×7): 2 via RESPIRATORY_TRACT
  Filled 2023-06-06: qty 8.8

## 2023-06-06 MED ORDER — ASPIRIN 81 MG PO TBEC
81.0000 mg | DELAYED_RELEASE_TABLET | Freq: Every day | ORAL | Status: DC
  Filled 2023-06-06 (×2): qty 1

## 2023-06-06 MED ORDER — MELATONIN 5 MG PO TABS
5.0000 mg | ORAL_TABLET | Freq: Every day | ORAL | Status: DC
  Administered 2023-06-06 – 2023-06-08 (×3): 5 mg via ORAL
  Filled 2023-06-06 (×3): qty 1

## 2023-06-06 NOTE — Progress Notes (Signed)
PROGRESS NOTE    Victor Ramos  NUU:[id number] DOB: 1941-09-16 DOA: 06/05/2023 PCP: [facility]   Assessment & Plan:   Principal Problem:   Cellulitis of right upper extremity Active Problems:   Sepsis (HCC)   CAD (coronary artery disease)   HTN (hypertension)   Chronic a-fib (HCC)   COPD (chronic obstructive pulmonary disease) (HCC)   Thrombocytopenia (HCC)   Benign localized prostatic hyperplasia with lower urinary tract symptoms (LUTS)   Acute renal failure superimposed on stage 3a chronic kidney disease (HCC)   Depression   CLL (chronic lymphocytic leukemia) (HCC)   Diarrhea  Assessment and Plan: Sepsis: met criteria w/ leukocytosis, tachycardia, tachypnea, elevated lactic acid & likely secondary to right arm cellulitis. Continue on IV rocephin, vanco. Blood cxs NGTD  Right arm cellulitis: continue on IV rocephin, vanco. [place] or RUE was neg for DVT    Hx of CAD: continue on aspirin   HTN: continue on coreg. Hold lisinopril. IV hydralazine   Chronic a-fib: continue on coreg, eliquis    COPD: w/o exacerbation. Bronchodilators prn    Thrombocytopenia: etiology unclear. Trending down. Will continue to monitor     BPH: continue on home dose of proscar, flomax   AKI on CKDIIIa: Cr is trending down from day prior. Hold lisinopril.    Depression: severity unknown. Continue on home dose of zoloft    CLL: in remission. Follows outpatient w/ [facility]   Diarrhea: etiology unclear. C. diff ordered      DVT prophylaxis: eliquis  Code Status: DNR Family Communication: discuss pt's care w/ pt's family at bedside and answered their questions  Disposition Plan: likely d/c back home   Level of care: Telemetry Medical Status is: Inpatient Remains inpatient appropriate because: severity of illness, requiring IV abxs     Consultants:    Procedures:  Antimicrobials: vanco, rocephin    Subjective: Pt c/o right pain & swelling    Objective: Vitals:   06/05/23 2030 06/05/23 2325 06/06/23 0437 06/06/23 0520  BP:  (!) 161/92 (!) 174/86 (!) 154/87  Pulse: 93 (!) 104 86   Resp: 19 18 20    Temp:  98.5 F (36.9 C) 98 F (36.7 C)   TempSrc:  Oral Oral   SpO2: 96% 96% 98%   Weight:      Height:        Intake/Output Summary (Last 24 hours) at 06/06/2023 0814 Last data filed at 06/06/2023 0600 Gross per 24 hour  Intake 484.69 ml  Output --  Net 484.69 ml   Filed Weights   06/05/23 1935  Weight: 81.6 kg    Examination:  General exam: Appears calm and comfortable  Respiratory system: Clear to auscultation. Respiratory effort normal. Cardiovascular system: S1 & S2+. No rubs, gallops or clicks. Gastrointestinal system: Abdomen is nondistended, soft and nontender. Normal bowel sounds heard. Central nervous system: Alert and oriented. Moves all extremities  Psychiatry: Judgement and insight appear normal. Mood & affect appropriate.     Data Reviewed: I have personally reviewed following labs and imaging studies  CBC: Recent Labs  Lab 06/05/23 1952 06/06/23 0059  WBC 16.4* 13.5*  NEUTROABS 8.6*  --   HGB 14.4 11.7*  HCT 43.0 35.3*  MCV 88.7 87.2  PLT 101* 82*   Basic Metabolic Panel: Recent Labs  Lab 06/05/23 1952 06/06/23 0059  NA 137 138  K 3.9 3.6  CL 100 105  CO2 26 23  GLUCOSE 127* 119*  BUN 31* 30*  CREATININE 1.71* 1.48*  CALCIUM 9.4 8.6*   GFR: Estimated Creatinine Clearance: 40.5 mL/min (A) (by C-G formula based on SCr of 1.48 mg/dL (H)). Liver Function Tests: Recent Labs  Lab 06/05/23 1952  AST 15  ALT 14  ALKPHOS 41  BILITOT 1.0  PROT 8.4*  ALBUMIN 4.5   No results for input(s): "LIPASE", "AMYLASE" in the last 168 hours. No results for input(s): "AMMONIA" in the last 168 hours. Coagulation Profile: Recent Labs  Lab 06/05/23 1952  INR 1.7*   Cardiac Enzymes: No results for input(s): "CKTOTAL", "CKMB", "CKMBINDEX", "TROPONINI" in the last 168 hours. BNP (last 3  results) No results for input(s): "PROBNP" in the last 8760 hours. HbA1C: No results for input(s): "HGBA1C" in the last 72 hours. CBG: No results for input(s): "GLUCAP" in the last 168 hours. Lipid Profile: No results for input(s): "CHOL", "HDL", "LDLCALC", "TRIG", "CHOLHDL", "LDLDIRECT" in the last 72 hours. Thyroid Function Tests: No results for input(s): "TSH", "T4TOTAL", "FREET4", "T3FREE", "THYROIDAB" in the last 72 hours. Anemia Panel: No results for input(s): "VITAMINB12", "FOLATE", "FERRITIN", "TIBC", "IRON", "RETICCTPCT" in the last 72 hours. Sepsis Labs: Recent Labs  Lab 06/05/23 1952 06/05/23 2130  PROCALCITON 1.85  --   LATICACIDVEN 1.5 1.3    Recent Results (from the past 240 hour(s))  Culture, blood (Routine x 2)     Status: None (Preliminary result)   Collection Time: 06/05/23  7:52 PM   Specimen: BLOOD LEFT FOREARM  Result Value Ref Range Status   Specimen Description BLOOD LEFT FOREARM  Final   Special Requests   Final    BOTTLES DRAWN AEROBIC AND ANAEROBIC Blood Culture adequate volume   Culture   Final    NO GROWTH < 12 HOURS Performed at [facility], [address], [place], [place] [address]    Report Status PENDING  Incomplete  Culture, blood (Routine x 2)     Status: None (Preliminary result)   Collection Time: 06/06/23 12:59 AM   Specimen: BLOOD  Result Value Ref Range Status   Specimen Description BLOOD BLOOD LEFT HAND  Final   Special Requests   Final    BOTTLES DRAWN AEROBIC AND ANAEROBIC Blood Culture adequate volume   Culture   Final    NO GROWTH < 12 HOURS Performed at [facility], [address], [place], [place] [address]    Report Status PENDING  Incomplete         Radiology Studies: US Venous Img Upper Uni Right(DVT)  Result Date: 06/05/2023 CLINICAL DATA:  86310 Swelling 86310. Left lower lobe malignancy. Pain, edema, anticoagulation therapy, numbness, color changes EXAM: Right UPPER EXTREMITY VENOUS  DOPPLER ULTRASOUND TECHNIQUE: Gray-scale sonography with graded compression, as well as color Doppler and duplex ultrasound were performed to evaluate the upper extremity deep venous system from the level of the subclavian vein and including the jugular, axillary, basilic, radial, ulnar and upper cephalic vein. Spectral Doppler was utilized to evaluate flow at rest and with distal augmentation maneuvers. COMPARISON:  None Available. FINDINGS: Contralateral Subclavian Vein: Respiratory phasicity is normal and symmetric with the symptomatic side. No evidence of thrombus. Normal compressibility. Internal Jugular Vein: No evidence of thrombus. Normal compressibility, respiratory phasicity and response to augmentation. Subclavian Vein: No evidence of thrombus. Normal compressibility, respiratory phasicity and response to augmentation. Axillary Vein: No evidence of thrombus. Normal compressibility, respiratory phasicity and response to augmentation. Cephalic Vein: No evidence of thrombus. Normal compressibility, respiratory phasicity and response to augmentation. Basilic Vein: No evidence of  thrombus. Normal compressibility, respiratory phasicity and response to augmentation. Brachial Veins: No evidence of thrombus. Normal compressibility, respiratory phasicity and response to augmentation. Radial Veins: No evidence of thrombus. Normal compressibility, respiratory phasicity and response to augmentation. Ulnar Veins: No evidence of thrombus. Normal compressibility, respiratory phasicity and response to augmentation. Venous Reflux:  None visualized. Other Findings:  None visualized. IMPRESSION: No evidence of DVT within the right upper extremity. Electronically Signed   By: Tish Frederickson M.D.   On: 06/05/2023 21:10   DG Chest 1 View  Result Date: 06/05/2023 CLINICAL DATA:  Sepsis EXAM: CHEST  1 VIEW COMPARISON:  09/24/2005 FINDINGS: Mild cardiomegaly. Aortic atherosclerosis. No focal airspace consolidation, pleural  effusion, or pneumothorax. IMPRESSION: Mild cardiomegaly. No acute cardiopulmonary findings. Electronically Signed   By: Duanne Guess D.O.   On: 06/05/2023 20:28        Scheduled Meds:  apixaban  5 mg Oral BID   aspirin EC  81 mg Oral Daily   carvedilol  6.25 mg Oral BID WC   finasteride  5 mg Oral Daily   melatonin  5 mg Oral QHS   mometasone-formoterol  2 puff Inhalation BID   sertraline  50 mg Oral Daily   tamsulosin  0.4 mg Oral Daily   tiotropium  18 mcg Inhalation Daily   traZODone  100 mg Oral QHS   Continuous Infusions:  sodium chloride 75 mL/hr at 06/05/23 2338   cefTRIAXone (ROCEPHIN)  IV 1 g (06/06/23 0016)   vancomycin 1,000 mg (06/06/23 0517)     LOS: 1 day    Time spent: 35 mins    Charise Killian, MD [facility] Pager [phone]  If 7PM-7AM, please contact night-coverage [email or web address] 06/06/2023, 8:14 AM

## 2023-06-06 NOTE — Progress Notes (Signed)
Pharmacy Antibiotic Note  Victor Ramos is a 82 y.o. male admitted on 06/05/2023 with cellulitis.  Pharmacy has been consulted for Vancomycin dosing.  Plan: Continue vancomycin 1 gram IV every 24 hours Estimated AUC 448, Cmin 11.9 IBW, Scr 1.48, Vd 0.72 Vancomycin levels at steady state or as clinically indicated Ceftriaxone 1 gram Iv every 24 hours per provider Follow renal function for further adjustments  Pharmacy will continue to follow and will adjust abx dosing whenever warranted.  Temp (24hrs), Avg:98.6 F (37 C), Min:98 F (36.7 C), Max:99.4 F (37.4 C)   Recent Labs  Lab 06/05/23 1952 06/05/23 2130 06/06/23 0059  WBC 16.4*  --  13.5*  CREATININE 1.71*  --  1.48*  LATICACIDVEN 1.5 1.3  --     Estimated Creatinine Clearance: 40.5 mL/min (A) (by C-G formula based on SCr of 1.48 mg/dL (H)).    Allergies  Allergen Reactions   Atorvastatin     Other Reaction(s): Delirium, Memory impairment, Delirium, Memory impairment    Antimicrobials this admission: 8/8 Vancomycin >>  8/8 Ceftriaxone >>   Microbiology results: 8/8 BCx: Pngtd  Thank you for allowing pharmacy to be a part of this patient's care.  Barrie Folk, PharmD 06/06/2023 7:32 AM

## 2023-06-06 NOTE — Plan of Care (Signed)

## 2023-06-07 DIAGNOSIS — L03113 Cellulitis of right upper limb: Secondary | ICD-10-CM | POA: Diagnosis not present

## 2023-06-07 MED ORDER — HYDRALAZINE HCL 20 MG/ML IJ SOLN: 20.0000 mg | Freq: Four times a day (QID) | INTRAMUSCULAR | Status: DC | PRN

## 2023-06-07 MED ORDER — NIFEDIPINE ER OSMOTIC RELEASE 30 MG PO TB24
90.0000 mg | ORAL_TABLET | Freq: Every day | ORAL | Status: DC
  Administered 2023-06-07 – 2023-06-09 (×3): 90 mg via ORAL
  Filled 2023-06-07 (×3): qty 3

## 2023-06-07 MED ORDER — SENNOSIDES-DOCUSATE SODIUM 8.6-50 MG PO TABS
2.0000 | ORAL_TABLET | Freq: Two times a day (BID) | ORAL | Status: DC
  Administered 2023-06-07 – 2023-06-09 (×5): 2 via ORAL
  Filled 2023-06-07 (×5): qty 2

## 2023-06-07 MED ORDER — CARVEDILOL 12.5 MG PO TABS
12.5000 mg | ORAL_TABLET | Freq: Two times a day (BID) | ORAL | Status: DC
  Administered 2023-06-07: 12.5 mg via ORAL
  Filled 2023-06-07: qty 1

## 2023-06-07 MED ORDER — CARVEDILOL 6.25 MG PO TABS
6.2500 mg | ORAL_TABLET | Freq: Two times a day (BID) | ORAL | Status: DC
  Administered 2023-06-07 – 2023-06-09 (×4): 6.25 mg via ORAL
  Filled 2023-06-07 (×4): qty 1

## 2023-06-07 MED ORDER — SERTRALINE HCL 50 MG PO TABS
75.0000 mg | ORAL_TABLET | Freq: Every day | ORAL | Status: DC
  Administered 2023-06-08 – 2023-06-09 (×2): 75 mg via ORAL
  Filled 2023-06-07 (×2): qty 2

## 2023-06-07 MED ORDER — VANCOMYCIN HCL 1250 MG/250ML IV SOLN
1250.0000 mg | INTRAVENOUS | Status: DC
  Administered 2023-06-08 – 2023-06-09 (×2): 1250 mg via INTRAVENOUS
  Filled 2023-06-07 (×2): qty 250

## 2023-06-07 MED ORDER — LISINOPRIL 20 MG PO TABS
40.0000 mg | ORAL_TABLET | Freq: Every day | ORAL | Status: DC
  Administered 2023-06-07 – 2023-06-09 (×3): 40 mg via ORAL
  Filled 2023-06-07 (×3): qty 2

## 2023-06-07 MED ORDER — CARVEDILOL 12.5 MG PO TABS: 12.5000 mg | ORAL_TABLET | Freq: Two times a day (BID) | ORAL | Status: DC

## 2023-06-07 NOTE — Progress Notes (Signed)
PROGRESS NOTE    Victor Ramos  JYN:[id number] DOB: June 28, 1941 DOA: 06/05/2023 [facility]: [facility]   Assessment & Plan:   Principal Problem:   Cellulitis of right upper extremity Active Problems:   Sepsis (HCC)   CAD (coronary artery disease)   HTN (hypertension)   Chronic a-fib (HCC)   COPD (chronic obstructive pulmonary disease) (HCC)   Thrombocytopenia (HCC)   Benign localized prostatic hyperplasia with lower urinary tract symptoms (LUTS)   Acute renal failure superimposed on stage 3a chronic kidney disease (HCC)   Depression   CLL (chronic lymphocytic leukemia) (HCC)   Diarrhea  Assessment and Plan: Sepsis: met criteria w/ leukocytosis, tachycardia, tachypnea, elevated lactic acid & likely secondary to right arm cellulitis. Continue on IV vanco, rocephin. Blood cxs NGTD. Sepsis resolved   Right arm cellulitis: slight improvement. Continue on IV vanco, rocephin. [place] of RUE was neg for DVT  Hx of CAD: continue on coreg, lisinopril. No longer taking aspirin as per pt's wife    HTN: continue on coreg & restarted lisinopril, nifedipine. IV hydralazine prn    Chronic a-fib:continue on coreg, eliquis    COPD: w/o exacerbation. Bronchodilators prn    Thrombocytopenia: etiology unclear. Labile. Will continue to monitor    BPH: continue on home dose of flomax, proscar     AKI on CKDIIIa: Cr is trending down daily. Restart lisinopril    Depression: severity unknown. Continue on home dose of zoloft   CLL: in remission. Follows outpatient w/ [facility]   Diarrhea: resolved       DVT prophylaxis: eliquis  Code Status: DNR Family Communication: discuss pt's care w/ pt's family at bedside and answered their questions  Disposition Plan: likely d/c back home   Level of care: Telemetry Medical Status is: Inpatient Remains inpatient appropriate because: severity of illness, requiring IV abxs     Consultants:    Procedures:  Antimicrobials:  vanco, rocephin    Subjective: Pt c/o swelling of right arm   Objective: Vitals:   06/06/23 0520 06/06/23 1929 06/07/23 0519 06/07/23 0559  BP: (!) 154/87 (!) 181/89 (!) 175/84 (!) 163/102  Pulse:  (!) 101 70 97  Resp:  17 18 (!) 22  Temp:  98.6 F (37 C) 98.6 F (37 C) 98.2 F (36.8 C)  TempSrc:    Oral  SpO2:  93% 93% 94%  Weight:      Height:        Intake/Output Summary (Last 24 hours) at 06/07/2023 0829 Last data filed at 06/07/2023 0141 Gross per 24 hour  Intake 2316.26 ml  Output 800 ml  Net 1516.26 ml   Filed Weights   06/05/23 1935  Weight: 81.6 kg    Examination:  General exam: appears calm & comfortable Respiratory system: clear breath sounds b/l  Cardiovascular system: S1/S2+. No rubs or clicks Gastrointestinal system: abd is soft, NT, ND & hypoactive bowel sounds  Central nervous system: alert and oriented. Moves all extremities   Psychiatry: judgement and insight appears normal. Appropriate mood and affect     Data Reviewed: I have personally reviewed following labs and imaging studies  CBC: Recent Labs  Lab 06/05/23 1952 06/06/23 0059 06/07/23 0453  WBC 16.4* 13.5* 14.2*  NEUTROABS 8.6*  --   --   HGB 14.4 11.7* 13.2  HCT 43.0 35.3* 40.6  MCV 88.7 87.2 89.8  PLT 101* 82* 109*   Basic Metabolic Panel: Recent Labs  Lab 06/05/23 1952 06/06/23 0059 06/07/23 0453  NA 137 138 139  K 3.9 3.6 3.8  CL 100 105 109  CO2 26 23 21*  GLUCOSE 127* 119* 152*  BUN 31* 30* 22  CREATININE 1.71* 1.48* 1.22  CALCIUM 9.4 8.6* 8.7*   GFR: Estimated Creatinine Clearance: 49.1 mL/min (by C-G formula based on SCr of 1.22 mg/dL). Liver Function Tests: Recent Labs  Lab 06/05/23 1952  AST 15  ALT 14  ALKPHOS 41  BILITOT 1.0  PROT 8.4*  ALBUMIN 4.5   No results for input(s): "LIPASE", "AMYLASE" in the last 168 hours. No results for input(s): "AMMONIA" in the last 168 hours. Coagulation Profile: Recent Labs  Lab 06/05/23 1952  INR 1.7*    Cardiac Enzymes: No results for input(s): "CKTOTAL", "CKMB", "CKMBINDEX", "TROPONINI" in the last 168 hours. BNP (last 3 results) No results for input(s): "PROBNP" in the last 8760 hours. HbA1C: No results for input(s): "HGBA1C" in the last 72 hours. CBG: No results for input(s): "GLUCAP" in the last 168 hours. Lipid Profile: No results for input(s): "CHOL", "HDL", "LDLCALC", "TRIG", "CHOLHDL", "LDLDIRECT" in the last 72 hours. Thyroid Function Tests: No results for input(s): "TSH", "T4TOTAL", "FREET4", "T3FREE", "THYROIDAB" in the last 72 hours. Anemia Panel: No results for input(s): "VITAMINB12", "FOLATE", "FERRITIN", "TIBC", "IRON", "RETICCTPCT" in the last 72 hours. Sepsis Labs: Recent Labs  Lab 06/05/23 1952 06/05/23 2130  PROCALCITON 1.85  --   LATICACIDVEN 1.5 1.3    Recent Results (from the past 240 hour(s))  Culture, blood (Routine x 2)     Status: None (Preliminary result)   Collection Time: 06/05/23  7:52 PM   Specimen: BLOOD LEFT FOREARM  Result Value Ref Range Status   Specimen Description BLOOD LEFT FOREARM  Final   Special Requests   Final    BOTTLES DRAWN AEROBIC AND ANAEROBIC Blood Culture adequate volume   Culture   Final    NO GROWTH 2 DAYS Performed at [facility], [address] [place], [place] [address]    Report Status PENDING  Incomplete  Culture, blood (Routine x 2)     Status: None (Preliminary result)   Collection Time: 06/06/23 12:59 AM   Specimen: BLOOD  Result Value Ref Range Status   Specimen Description BLOOD BLOOD LEFT HAND  Final   Special Requests   Final    BOTTLES DRAWN AEROBIC AND ANAEROBIC Blood Culture adequate volume   Culture   Final    NO GROWTH 1 DAY Performed at [facility], [address], [place], [place] [address]    Report Status PENDING  Incomplete         Radiology Studies: US Venous Img Upper Uni Right(DVT)  Result Date: 06/05/2023 CLINICAL DATA:  86310 Swelling 86310. Left lower  lobe malignancy. Pain, edema, anticoagulation therapy, numbness, color changes EXAM: Right UPPER EXTREMITY VENOUS DOPPLER ULTRASOUND TECHNIQUE: Gray-scale sonography with graded compression, as well as color Doppler and duplex ultrasound were performed to evaluate the upper extremity deep venous system from the level of the subclavian vein and including the jugular, axillary, basilic, radial, ulnar and upper cephalic vein. Spectral Doppler was utilized to evaluate flow at rest and with distal augmentation maneuvers. COMPARISON:  None Available. FINDINGS: Contralateral Subclavian Vein: Respiratory phasicity is normal and symmetric with the symptomatic side. No evidence of thrombus. Normal compressibility. Internal Jugular Vein: No evidence of thrombus. Normal compressibility, respiratory phasicity and response to augmentation. Subclavian Vein: No evidence of thrombus. Normal compressibility, respiratory phasicity and response to augmentation. Axillary Vein: No evidence of  thrombus. Normal compressibility, respiratory phasicity and response to augmentation. Cephalic Vein: No evidence of thrombus. Normal compressibility, respiratory phasicity and response to augmentation. Basilic Vein: No evidence of thrombus. Normal compressibility, respiratory phasicity and response to augmentation. Brachial Veins: No evidence of thrombus. Normal compressibility, respiratory phasicity and response to augmentation. Radial Veins: No evidence of thrombus. Normal compressibility, respiratory phasicity and response to augmentation. Ulnar Veins: No evidence of thrombus. Normal compressibility, respiratory phasicity and response to augmentation. Venous Reflux:  None visualized. Other Findings:  None visualized. IMPRESSION: No evidence of DVT within the right upper extremity. Electronically Signed   By: Tish Frederickson M.D.   On: 06/05/2023 21:10   DG Chest 1 View  Result Date: 06/05/2023 CLINICAL DATA:  Sepsis EXAM: CHEST  1 VIEW  COMPARISON:  09/24/2005 FINDINGS: Mild cardiomegaly. Aortic atherosclerosis. No focal airspace consolidation, pleural effusion, or pneumothorax. IMPRESSION: Mild cardiomegaly. No acute cardiopulmonary findings. Electronically Signed   By: Duanne Guess D.O.   On: 06/05/2023 20:28        Scheduled Meds:  apixaban  5 mg Oral BID   aspirin EC  81 mg Oral Daily   carvedilol  12.5 mg Oral BID WC   finasteride  5 mg Oral Daily   melatonin  5 mg Oral QHS   mometasone-formoterol  2 puff Inhalation BID   sertraline  50 mg Oral Daily   tamsulosin  0.4 mg Oral Daily   tiotropium  18 mcg Inhalation Daily   traZODone  100 mg Oral QHS   Continuous Infusions:  sodium chloride 75 mL/hr at 06/07/23 0436   cefTRIAXone (ROCEPHIN)  IV Stopped (06/06/23 2134)   vancomycin 1,000 mg (06/07/23 0514)     LOS: 2 days    Time spent: 35 mins    Charise Killian, MD [facility] Pager [phone]  If 7PM-7AM, please contact night-coverage [email or web address] 06/07/2023, 8:29 AM

## 2023-06-07 NOTE — Progress Notes (Signed)
Pharmacy Antibiotic Note  Victor Ramos is a 82 y.o. male admitted on 06/05/2023 with cellulitis.  Pharmacy has been consulted for Vancomycin dosing.  Plan: Vancomycin 1250 mg IV every 24 hours Estimated AUC 471, Cmin 11.6 IBW, Scr 1.22, Vd 0.72 Vancomycin levels at steady state or as clinically indicated Ceftriaxone 1 gram Iv every 24 hours per provider Follow renal function for further adjustments  Pharmacy will continue to follow and will adjust abx dosing whenever warranted.  Temp (24hrs), Avg:98.6 F (37 C), Min:98.2 F (36.8 C), Max:99.1 F (37.3 C)   Recent Labs  Lab 06/05/23 1952 06/05/23 2130 06/06/23 0059 06/07/23 0453  WBC 16.4*  --  13.5* 14.2*  CREATININE 1.71*  --  1.48* 1.22  LATICACIDVEN 1.5 1.3  --   --     Estimated Creatinine Clearance: 49.1 mL/min (by C-G formula based on SCr of 1.22 mg/dL).    Allergies  Allergen Reactions   Atorvastatin     Other Reaction(s): Delirium, Memory impairment, Delirium, Memory impairment    Antimicrobials this admission: 8/8 Vancomycin >>  8/8 Ceftriaxone >>   Microbiology results: 8/8 BCx: ngtd  Thank you for allowing pharmacy to be a part of this patient's care.  Bettey Costa, PharmD 06/07/2023 1:49 PM

## 2023-06-07 NOTE — Progress Notes (Signed)
No stool this shift to send for C diff.  Wife states he takes 4 stool softeners a day and sometimes she gives him a suppository if needed, she states she just "doesn't want him to be constipated".

## 2023-06-07 NOTE — Progress Notes (Signed)
IV consult canceled per RN upon arrival to pt room.

## 2023-06-08 DIAGNOSIS — L03113 Cellulitis of right upper limb: Secondary | ICD-10-CM | POA: Diagnosis not present

## 2023-06-08 LAB — BASIC METABOLIC PANEL WITH GFR
Anion gap: 8 (ref 5–15)
BUN: 21 mg/dL (ref 8–23)
CO2: 21 mmol/L — ABNORMAL LOW (ref 22–32)
Calcium: 8.5 mg/dL — ABNORMAL LOW (ref 8.9–10.3)
Chloride: 108 mmol/L (ref 98–111)
Creatinine, Ser: 1.15 mg/dL (ref 0.61–1.24)
GFR, Estimated: >60 mL/min (ref >60)
Glucose, Bld: 125 mg/dL — ABNORMAL HIGH (ref 70–99)
Potassium: 3.6 mmol/L (ref 3.5–5.1)
Sodium: 137 mmol/L (ref 135–145)

## 2023-06-08 LAB — CBC
HCT: 33.3 % — ABNORMAL LOW (ref 39.0–52.0)
Hemoglobin: 11.2 g/dL — ABNORMAL LOW (ref 13.0–17.0)
MCH: 29.1 pg (ref 26.0–34.0)
MCHC: 33.6 g/dL (ref 30.0–36.0)
MCV: 86.5 fL (ref 80.0–100.0)
Platelets: 102 10*3/uL — ABNORMAL LOW (ref 150–400)
RBC: 3.85 MIL/uL — ABNORMAL LOW (ref 4.22–5.81)
RDW: 13.6 % (ref 11.5–15.5)
WBC: 7.3 10*3/uL (ref 4.0–10.5)
nRBC: 0 % (ref 0.0–0.2)

## 2023-06-08 MED ORDER — BISACODYL 10 MG RE SUPP
10.0000 mg | Freq: Every day | RECTAL | Status: DC
  Administered 2023-06-08 – 2023-06-09 (×2): 10 mg via RECTAL
  Filled 2023-06-08 (×2): qty 1

## 2023-06-08 NOTE — TOC CM/SW Note (Signed)
Transition of Care [facility]) - Inpatient Brief Assessment   Patient Details  Name: Victor Ramos MRN: [id number] Date of Birth: Feb 16, 1941  [facility] CM/SW Contact:    Kemper Durie, RN Phone Number: 06/08/2023, 2:35 PM   Clinical Narrative:  Patient has dementia, admitted from home with wife.  Attempted to speak with wife regarding potential TOC needs, message left.    Transition of Care Asessment: Insurance and Status: Insurance coverage has been reviewed Patient has primary care physician: Yes Home environment has been reviewed: Lives with wife Prior level of function:: has dementia Prior/Current Home Services: No current home services Social Determinants of Health Reivew: SDOH reviewed interventions complete Readmission risk has been reviewed: Yes Transition of care needs: transition of care needs identified, TOC will continue to follow

## 2023-06-08 NOTE — Progress Notes (Signed)
PROGRESS NOTE    Victor Ramos  WRU:[id number] DOB: 1941/10/15 DOA: 06/05/2023 PCP: [facility]   Assessment & Plan:   Principal Problem:   Cellulitis of right upper extremity Active Problems:   Sepsis (HCC)   CAD (coronary artery disease)   HTN (hypertension)   Chronic a-fib (HCC)   COPD (chronic obstructive pulmonary disease) (HCC)   Thrombocytopenia (HCC)   Benign localized prostatic hyperplasia with lower urinary tract symptoms (LUTS)   Acute renal failure superimposed on stage 3a chronic kidney disease (HCC)   Depression   CLL (chronic lymphocytic leukemia) (HCC)   Diarrhea  Assessment and Plan: Sepsis: met criteria w/ leukocytosis, tachycardia, tachypnea, elevated lactic acid & likely secondary to right arm cellulitis. Continue on IV vanco, rocephin. Blood cxs NGTD. Sepsis resolved   Right arm cellulitis: much improvement. Continue on IV rocephin, vanco and likely d/c home tomorrow w/ po abxs if cellulitis continues to improve  Hx of CAD: continue on coreg, lisinopril. No longer taking aspirin as per pt's wife    HTN: continue on coreg, nifedipine, lisinopril. IV hydralazine prn    Chronic a-fib: continue on eliquis, coreg     COPD: w/o exacerbation. Bronchodilators prn     Thrombocytopenia: etiology unclear. Labile. Avoid nephrotoxic meds    BPH: continue on home dose of proscar, flomax    AKI on CKDIIIa: Cr continues to trend down. Avoid nephrotoxic meds    Depression: severity unknown. Continue on home dose of sertraline    CLL: in remission. Follows outpatient w/ [facility]   Diarrhea: resolved   Constipation: continue on senokot and started on dulcolax supp      DVT prophylaxis: eliquis  Code Status: DNR Family Communication: discuss pt's care w/ pt's family at bedside and answered their questions  Disposition Plan: likely d/c back home   Level of care: Telemetry Medical Status is: Inpatient Remains inpatient appropriate  because: requiring IV abxs and can likely d/c home tomorrow     Consultants:    Procedures:  Antimicrobials: vanco, rocephin    Subjective: Pt c/o constipation   Objective: Vitals:   06/07/23 1558 06/07/23 1940 06/08/23 0403 06/08/23 0749  BP: 138/85 (!) 130/93 131/67 108/75  Pulse: 81 80 95 94  Resp: 16 20 20 20   Temp: 98.5 F (36.9 C) 98.2 F (36.8 C) 98.5 F (36.9 C) 98.4 F (36.9 C)  TempSrc: Oral   Oral  SpO2: 96% 97% 93% 90%  Weight:      Height:        Intake/Output Summary (Last 24 hours) at 06/08/2023 0757 Last data filed at 06/08/2023 0405 Gross per 24 hour  Intake --  Output 1400 ml  Net -1400 ml   Filed Weights   06/05/23 1935  Weight: 81.6 kg    Examination:  General exam: Appears comfortable  Respiratory system: clear breath sounds b/l  Cardiovascular system: S1 & S2+. No rubs or gallops  Gastrointestinal system: abd is soft, NT, ND & hypoactive bowel sounds   Central nervous system: alert and oriented. Moves all extremities   Psychiatry: judgement and insight appears normal. Flat mood and affect  Skin: right upper extremity is edematous, erythematous & warm to palpation (much improved)    Data Reviewed: I have personally reviewed following labs and imaging studies  CBC: Recent Labs  Lab 06/05/23 1952 06/06/23 0059 06/07/23 0453 06/08/23 0441  WBC 16.4* 13.5* 14.2* 7.3  NEUTROABS 8.6*  --   --   --  HGB 14.4 11.7* 13.2 11.2*  HCT 43.0 35.3* 40.6 33.3*  MCV 88.7 87.2 89.8 86.5  PLT 101* 82* 109* 102*   Basic Metabolic Panel: Recent Labs  Lab 06/05/23 1952 06/06/23 0059 06/07/23 0453 06/08/23 0441  NA 137 138 139 137  K 3.9 3.6 3.8 3.6  CL 100 105 109 108  CO2 26 23 21* 21*  GLUCOSE 127* 119* 152* 125*  BUN 31* 30* 22 21  CREATININE 1.71* 1.48* 1.22 1.15  CALCIUM 9.4 8.6* 8.7* 8.5*   GFR: Estimated Creatinine Clearance: 52.1 mL/min (by C-G formula based on SCr of 1.15 mg/dL). Liver Function Tests: Recent Labs  Lab  06/05/23 1952  AST 15  ALT 14  ALKPHOS 41  BILITOT 1.0  PROT 8.4*  ALBUMIN 4.5   No results for input(s): "LIPASE", "AMYLASE" in the last 168 hours. No results for input(s): "AMMONIA" in the last 168 hours. Coagulation Profile: Recent Labs  Lab 06/05/23 1952  INR 1.7*   Cardiac Enzymes: No results for input(s): "CKTOTAL", "CKMB", "CKMBINDEX", "TROPONINI" in the last 168 hours. BNP (last 3 results) No results for input(s): "PROBNP" in the last 8760 hours. HbA1C: No results for input(s): "HGBA1C" in the last 72 hours. CBG: No results for input(s): "GLUCAP" in the last 168 hours. Lipid Profile: No results for input(s): "CHOL", "HDL", "LDLCALC", "TRIG", "CHOLHDL", "LDLDIRECT" in the last 72 hours. Thyroid Function Tests: No results for input(s): "TSH", "T4TOTAL", "FREET4", "T3FREE", "THYROIDAB" in the last 72 hours. Anemia Panel: No results for input(s): "VITAMINB12", "FOLATE", "FERRITIN", "TIBC", "IRON", "RETICCTPCT" in the last 72 hours. Sepsis Labs: Recent Labs  Lab 06/05/23 1952 06/05/23 2130  PROCALCITON 1.85  --   LATICACIDVEN 1.5 1.3    Recent Results (from the past 240 hour(s))  Culture, blood (Routine x 2)     Status: None (Preliminary result)   Collection Time: 06/05/23  7:52 PM   Specimen: BLOOD LEFT FOREARM  Result Value Ref Range Status   Specimen Description BLOOD LEFT FOREARM  Final   Special Requests   Final    BOTTLES DRAWN AEROBIC AND ANAEROBIC Blood Culture adequate volume   Culture   Final    NO GROWTH 3 DAYS Performed at [facility], [address] [place], [place] [address]    Report Status PENDING  Incomplete  Culture, blood (Routine x 2)     Status: None (Preliminary result)   Collection Time: 06/06/23 12:59 AM   Specimen: BLOOD  Result Value Ref Range Status   Specimen Description BLOOD BLOOD LEFT HAND  Final   Special Requests   Final    BOTTLES DRAWN AEROBIC AND ANAEROBIC Blood Culture adequate volume   Culture   Final     NO GROWTH 2 DAYS Performed at [facility], [address] [place], [place] [address]    Report Status PENDING  Incomplete         Radiology Studies: No results found.      Scheduled Meds:  apixaban  5 mg Oral BID   carvedilol  6.25 mg Oral BID WC   finasteride  5 mg Oral Daily   lisinopril  40 mg Oral Daily   melatonin  5 mg Oral QHS   mometasone-formoterol  2 puff Inhalation BID   NIFEdipine  90 mg Oral Daily   senna-docusate  2 tablet Oral BID   sertraline  75 mg Oral Daily   tamsulosin  0.4 mg Oral Daily   tiotropium  18 mcg Inhalation Daily  traZODone  100 mg Oral QHS   Continuous Infusions:  cefTRIAXone (ROCEPHIN)  IV Stopped (06/07/23 2142)   vancomycin 1,250 mg (06/08/23 0458)     LOS: 3 days    Time spent: 25 mins    Charise Killian, MD [facility] Pager [phone]  If 7PM-7AM, please contact night-coverage [email or web address] 06/08/2023, 7:57 AM

## 2023-06-09 DIAGNOSIS — L03113 Cellulitis of right upper limb: Secondary | ICD-10-CM | POA: Diagnosis not present

## 2023-06-09 MED ORDER — DOXYCYCLINE MONOHYDRATE 100 MG PO TABS: 100.0000 mg | ORAL_TABLET | Freq: Two times a day (BID) | ORAL | 0 refills | Status: AC

## 2023-06-09 MED ORDER — CEPHALEXIN 500 MG PO CAPS: 500.0000 mg | ORAL_CAPSULE | Freq: Four times a day (QID) | ORAL | 0 refills | Status: AC

## 2023-06-09 NOTE — Progress Notes (Signed)
Discharge instructions reviewed with patient including followup visits and new medications/medication changes.  Understanding was verbalized and all questions were answered.  IV removed without complication; patient tolerated well.  Patient discharged home via wheelchair in stable condition escorted by volunteer staff.

## 2023-06-09 NOTE — Evaluation (Signed)
Physical Therapy Evaluation Patient Details Name: Victor Ramos MRN: [id number] DOB: April 03, 1941 Today's Date: 06/09/2023  History of Present Illness  Victor Ramos is a 82 y.o. male with medical history significant of dementia, a fib on Eliquis, HTN, HLD, CLL in remission, COPD, CAD, GIB, depression, BPH, CKD-3a, who presents with right forearm pain.  Clinical Impression  Pt was pleasant and motivated to participate during the session and put forth good effort throughout. Pt found seated in recliner with wife in room. Initial session readings of 91% SpO2 on room air and HR 73 bpm. Pt perform STS to RW with Mod A, cues given for hand placement and forward lean. Per wife pt uses lift at home to perform STS at baseline. Pt ambulated 40 feet in hallway with RW and chair follow, bout stopped secondary to pt fatigue. Pt given cues to keep upright posture when ambulating. After walking pt SpO2 was 89% with no c/c of SoB, dyspnea, or lightheadedness, HR was 78 bpm. Finished session with multiple STS to RW at Mod A, transfer quality slightly increased by end of session. Pt will benefit from continued PT services upon discharge to safely address deficits listed in patient problem list for decreased caregiver assistance and eventual return to PLOF.           If plan is discharge home, recommend the following: A little help with walking and/or transfers;A little help with bathing/dressing/bathroom;Assistance with cooking/housework;Direct supervision/assist for medications management;Help with stairs or ramp for entrance;Assist for transportation   Can travel by private vehicle        Equipment Recommendations None recommended by PT  Recommendations for Other Services       Functional Status Assessment Patient has had a recent decline in their functional status and demonstrates the ability to make significant improvements in function in a reasonable and predictable amount of time.      Precautions / Restrictions Precautions Precautions: Fall Restrictions Weight Bearing Restrictions: No Other Position/Activity Restrictions: Elevate RUE 2/2 edema      Mobility  Bed Mobility               General bed mobility comments: NT, pt found sitting up recliner at start of session    Transfers Overall transfer level: Needs assistance Equipment used: Rolling walker (2 wheels) Transfers: Sit to/from Stand Sit to Stand: Mod assist           General transfer comment: Cues for sequencing and hand placement, pt able to initially boost off of chair but needs mod A fully stand upright.    Ambulation/Gait Ambulation/Gait assistance: Min assist Gait Distance (Feet): 40 Feet Assistive device: Rolling walker (2 wheels) Gait Pattern/deviations: Step-through pattern, Decreased step length - right, Decreased step length - left, Decreased stride length Gait velocity: decreased     General Gait Details: generalized imbalance, leaning forward despite cues given to maintain upright posture. Cues gien to allow RUE to rest on walker  Stairs            Wheelchair Mobility     Tilt Bed    Modified Rankin (Stroke Patients Only)       Balance Overall balance assessment: Mild deficits observed, not formally tested                                           Pertinent Vitals/Pain Pain Assessment Pain Assessment: No/denies pain  Pain Score: 2  Pain Location: RUE, hand/wrist Pain Descriptors / Indicators: Sore Pain Intervention(s): Monitored during session, Limited activity within patient's tolerance    Home Living Family/patient expects to be discharged to:: Private residence Living Arrangements: Spouse/significant other;Children Available Help at Discharge: Family;Available 24 hours/day Type of Home: House Home Access: Ramped entrance       Home Layout: One level Home Equipment: Agricultural consultant (2 wheels);Rollator (4 wheels);Cane - single  point;Grab bars - toilet;Grab bars - tub/shower;Shower seat;BSC/3in1 , Retail buyer Additional Comments: adult son lives with them and works from home; is able to assist pt with transfers as needed.    Prior Function Prior Level of Function : Needs assist             Mobility Comments: walking with SPC outside, uses furniture insdie home ADLs Comments: Wife helps with all tasks, mainly set up. wife has been helping more recently due to RUE pain     Extremity/Trunk Assessment   Upper Extremity Assessment Upper Extremity Assessment: Defer to OT evaluation     Lower Extremity Assessment Lower Extremity Assessment: Generalized weakness       Communication   Communication Communication: Difficulty following commands/understanding Following commands: Follows one step commands consistently Cueing Techniques: Verbal cues;Visual cues  Cognition Arousal: Alert Behavior During Therapy: WFL for tasks assessed/performed Overall Cognitive Status: History of cognitive impairments - at baseline                                 General Comments: Wife states pt is at baseline, orientation N/T due to hx of dementia        General Comments General comments (skin integrity, edema, etc.): Pt on room air. Purewick and chair alarm activated at end of session.    Exercises Other Exercises Other Exercises: Multiple STS performed from recliner for pericare management   Assessment/Plan    PT Assessment Patient needs continued PT services  PT Problem List Decreased activity tolerance;Decreased balance;Decreased mobility;Decreased strength;Decreased safety awareness;Decreased knowledge of use of DME       PT Treatment Interventions DME instruction;Balance training;Gait training;Stair training;Functional mobility training;Therapeutic activities;Therapeutic exercise    PT Goals (Current goals can be found in the Care Plan section)  Acute Rehab PT Goals Patient Stated Goal: go  home PT Goal Formulation: With patient/family Time For Goal Achievement: 06/22/23 Potential to Achieve Goals: Good    Frequency Min 1X/week     Co-evaluation               AM-PAC PT "6 Clicks" Mobility  Outcome Measure Help needed turning from your back to your side while in a flat bed without using bedrails?: A Little Help needed moving from lying on your back to sitting on the side of a flat bed without using bedrails?: A Little Help needed moving to and from a bed to a chair (including a wheelchair)?: A Little Help needed standing up from a chair using your arms (e.g., wheelchair or bedside chair)?: A Little Help needed to walk in hospital room?: A Little Help needed climbing 3-5 steps with a railing? : A Little 6 Click Score: 18    End of Session Equipment Utilized During Treatment: Gait belt Activity Tolerance: Patient tolerated treatment well Patient left: in chair;with family/visitor present;with call bell/phone within reach;with bed alarm set Nurse Communication: Mobility status PT Visit Diagnosis: Unsteadiness on feet (R26.81);Muscle weakness (generalized) (M62.81)    Time: 1040-1110 PT  Time Calculation (min) (ACUTE ONLY): 30 min   Charges:                 Cecile Sheerer, SPT 06/09/23, 12:56 PM

## 2023-06-09 NOTE — TOC Transition Note (Addendum)
Transition of Care [facility]) - CM/SW Discharge Note   Patient Details  Name: Victor Ramos MRN: [id number] Date of Birth: 1941/02/03  [facility]) CM/SW Contact:  Margarito Liner, LCSW Phone Number: 06/09/2023, 1:06 PM   Clinical Narrative:   Patient has orders to discharge home today. Patient has been set up with [facility] for PT and OT. CSW filled out authorization request form and faxed it along with clinicals showing home health need. No further concerns. CSW signing off.    Per [facility] liaison, patient's new PCP at the [place] is Rogelia Rohrer, MD and care manager is Maudie Mercury. Phone number is [phone].  Final next level of care: Home w Home Health Services Barriers to Discharge: Barriers Resolved   Patient Goals and CMS Choice   Choice offered to / list presented to : Patient, Spouse  Discharge Placement                  Patient to be transferred to facility by: Wife Name of family member notified: Victor Ramos Patient and family notified of of transfer: 06/09/23  Discharge Plan and Services Additional resources added to the After Visit Summary for       Post Acute Care Choice: NA                    HH Arranged: PT, OT HH Agency: [facility] Date [facility] Agency Contacted: 06/09/23   Representative spoke with at [facility] Agency: Becky Sax  Social Determinants of Health (SDOH) Interventions SDOH Screenings   Food Insecurity: No Food Insecurity (06/05/2023)  Housing: Low Risk  (06/05/2023)  Transportation Needs: No Transportation Needs (06/05/2023)  Utilities: Not At Risk (06/05/2023)  Financial Resource Strain: Unknown (07/13/2018)  Physical Activity: Unknown (07/13/2018)  Social Connections: Unknown (07/13/2018)  Stress: Unknown (07/13/2018)  Tobacco Use: Medium Risk (06/05/2023)     Readmission Risk Interventions    06/09/2023    9:50 AM 06/08/2023    2:36 PM  Readmission Risk Prevention Plan  Transportation  Screening Complete Complete  PCP or Specialist Appt within 3-5 Days Complete Complete  HRI or Home Care Consult  Complete  Social Work Consult for Recovery Care Planning/Counseling Complete Complete  Palliative Care Screening Not Applicable Not Applicable  Medication Review Oceanographer) Complete Complete

## 2023-06-09 NOTE — TOC Initial Note (Addendum)
Transition of Care [facility]) - Initial/Assessment Note    Patient Details  Name: Victor Ramos MRN: [id number] Date of Birth: Mar 18, 1941  Transition of Care [facility]) CM/SW Contact:    Margarito Liner, LCSW Phone Number: 06/09/2023, 9:52 AM  Clinical Narrative:   Readmission prevention screen complete. Patient only oriented to self. Wife at bedside. CSW introduced role and explained that discharge planning would be discussed. PCP is a new provider in the geriatrics dept at the [facility]. Patient has not seen this provider yet so wife could not remember their name. Wife transports him to appointments. He uses the [employer]. If any new medications, they use [facility] in [place]. No issues obtaining medications. Patient lives home with wife and son. No home health prior to admission. He previously had it set up through the [place] but the services made him agitated, per wife. He has a RW, rollator, SPC, ramp, and rails at toilet and inside and outside the bathtub. No further concerns. CSW encouraged patient's wife to contact CSW as needed. CSW will continue to follow patient and his wife for support and facilitate return home once stable.     11:26 am: Patient and wife are agreeable to home health. Wife would like referral sent to the agency that worked with him recently but cannot remember their name. CSW unable to locate in chart review or Patient Ping. CSW sent secure email to [facility] liaison asking her to check her system since the services were authorized through them.            Expected Discharge Plan: Home/Self Care Barriers to Discharge: Continued Medical Work up   Patient Goals and CMS Choice            Expected Discharge Plan and Services     Post Acute Care Choice: NA Living arrangements for the past 2 months: Single Family Home                                      Prior Living Arrangements/Services Living arrangements for the past 2 months: Single Family  Home Lives with:: Spouse, Adult Children Patient language and need for interpreter reviewed:: Yes Do you feel safe going back to the place where you live?: Yes      Need for Family Participation in Patient Care: Yes (Comment) Care giver support system in place?: Yes (comment) Current home services: DME Criminal Activity/Legal Involvement Pertinent to Current Situation/Hospitalization: No - Comment as needed  Activities of Daily Living Home Assistive Devices/Equipment: Cane (specify quad or straight) ADL Screening (condition at time of admission) Patient's cognitive ability adequate to safely complete daily activities?: No Is the patient deaf or have difficulty hearing?: No Does the patient have difficulty seeing, even when wearing glasses/contacts?: Yes Does the patient have difficulty concentrating, remembering, or making decisions?: Yes Patient able to express need for assistance with ADLs?: No Does the patient have difficulty dressing or bathing?: Yes Independently performs ADLs?: No Does the patient have difficulty walking or climbing stairs?: Yes Weakness of Legs: Both Weakness of Arms/Hands: Right  Permission Sought/Granted Permission sought to share information with : Family Supports    Share Information with NAME: Kairon Peloso     Permission granted to share info w Relationship: Wife  Permission granted to share info w Contact Information: [phone]  Emotional Assessment Appearance:: Appears stated age Attitude/Demeanor/Rapport: Unable to Assess Affect (typically observed):  Appropriate, Calm, Pleasant Orientation: : Oriented to Self, Oriented to Place, Oriented to  Time, Oriented to Situation Alcohol / Substance Use: Not Applicable Psych Involvement: No (comment)  Admission diagnosis:  Cellulitis of right upper extremity [L03.113] Patient Active Problem List   Diagnosis Date Noted   Cellulitis of right upper extremity 06/05/2023   Sepsis (HCC) 06/05/2023   HTN  (hypertension) 06/05/2023   COPD (chronic obstructive pulmonary disease) (HCC) 06/05/2023   Acute renal failure superimposed on stage 3a chronic kidney disease (HCC) 06/05/2023   Thrombocytopenia (HCC) 06/05/2023   Depression 06/05/2023   Diarrhea 06/05/2023   Coagulation defect (HCC) 07/06/2020   Gout attack 03/28/2020   Knee pain 03/28/2020   Localized, primary osteoarthritis 03/28/2020   Osteoarthritis of knee 03/28/2020   Goals of care, counseling/discussion 08/23/2018   Symptomatic anemia    CLL (chronic lymphocytic leukemia) (HCC)    Lower GI bleed 07/22/2018   Recurrent inguinal hernia of right side with obstruction 07/13/2018   CAD (coronary artery disease) 04/13/2015   Chronic a-fib (HCC) 04/13/2015   Acute gouty arthropathy 05/11/2014   Arthropathy 05/11/2014   Benign essential HTN 05/11/2014   Cardiac dysrhythmia 05/11/2014   Dysuria 05/19/2013   Gross hematuria 04/14/2013   Cardiovascular disease 10/13/2012   Hyperlipidemia 10/13/2012   Benign localized prostatic hyperplasia with lower urinary tract symptoms (LUTS) 08/03/2012   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   Elevated prostate specific antigen (PSA) 08/03/2012   Incomplete emptying of bladder 08/03/2012   Increased frequency of urination 08/03/2012   Nocturia 08/03/2012   PCP:  [facility]:   [id number] Dan Humphreys, [place] - [address] AT [facility] OF [address] [place] [place] [address] Phone: [phone] Fax: [phone]  [facility] #[id number] Cheree Ditto,  - [address] AT [facility] OF [address] [place] [address] Phone: [phone] Fax: [phone]     Social Determinants of Health (SDOH) Social History: SDOH Screenings   Food Insecurity: No Food Insecurity (06/05/2023)  Housing: Low Risk  (06/05/2023)  Transportation Needs: No Transportation Needs  (06/05/2023)  Utilities: Not At Risk (06/05/2023)  Financial Resource Strain: Unknown (07/13/2018)  Physical Activity: Unknown (07/13/2018)  Social Connections: Unknown (07/13/2018)  Stress: Unknown (07/13/2018)  Tobacco Use: Medium Risk (06/05/2023)   SDOH Interventions:     Readmission Risk Interventions    06/09/2023    9:50 AM 06/08/2023    2:36 PM  Readmission Risk Prevention Plan  Transportation Screening Complete Complete  PCP or Specialist Appt within 3-5 Days Complete Complete  HRI or Home Care Consult  Complete  Social Work Consult for Recovery Care Planning/Counseling Complete Complete  Palliative Care Screening Not Applicable Not Applicable  Medication Review Oceanographer) Complete Complete

## 2023-06-09 NOTE — Evaluation (Signed)
Occupational Therapy Evaluation Patient Details Name: Victor Ramos MRN: [id number] DOB: [birth year] Today's Date: 06/09/2023   History of Present Illness Victor Ramos is a 82 y.o. male with medical history significant of dementia, a fib on Eliquis, HTN, HLD, CLL in remission, COPD, CAD, GIB, depression, BPH, CKD-3a, who presents with right forearm pain.   Clinical Impression   Patient received for OT evaluation. See flowsheet below for details of function. Generally, patient requiring MIN A for bed mobility, MOD A for functional transfer to standing, CGA sidestep to recliner, and overall MOD A for ADLs. Education provided to pt and wife on use of built-up handles (either washcloth with paper tape or purchased foam handles) for increased RUE use.  Patient will benefit from continued OT while in acute care.       If plan is discharge home, recommend the following: A lot of help with walking and/or transfers;A lot of help with bathing/dressing/bathroom;Assistance with cooking/housework;Assistance with feeding;Direct supervision/assist for medications management;Direct supervision/assist for financial management;Assist for transportation;Help with stairs or ramp for entrance;Supervision due to cognitive status    Functional Status Assessment  Patient has had a recent decline in their functional status and demonstrates the ability to make significant improvements in function in a reasonable and predictable amount of time.  Equipment Recommendations  None recommended by OT    Recommendations for Other Services       Precautions / Restrictions Precautions Precautions: Fall Restrictions Weight Bearing Restrictions: No Other Position/Activity Restrictions: Elevate RUE 2/2 edema      Mobility Bed Mobility Overal bed mobility: Needs Assistance Bed Mobility: Supine to Sit     Supine to sit: Min assist     General bed mobility comments: slightly assist for sidelying to sit;  difficulty using R hand very much during t/f    Transfers Overall transfer level: Needs assistance Equipment used: Rolling walker (2 wheels) Transfers: Sit to/from Stand, Bed to chair/wheelchair/BSC Sit to Stand: Mod assist     Step pivot transfers: Contact guard assist (Multiple cues)     General transfer comment: Lots of cues for hand placement, scooting forward, leaning forward prior to STS t/f. Gait belt used for safety. Pt then t/f from bed to chair.      Balance Overall balance assessment: Mild deficits observed, not formally tested                                         ADL either performed or assessed with clinical judgement   ADL Overall ADL's : Needs assistance/impaired   Eating/Feeding Details (indicate cue type and reason): anticipate setup-MIN A with built up handle; OT provided education to wife to encourage pt to eat independently instead of feeding him. OT demonstrated built-up handle with washcloth and paper tape. Grooming: Oral care;Set up;Supervision/safety;Sitting Grooming Details (indicate cue type and reason): seated in recliner; pt moving back and forth between R and L hands, as R grip weak; ultimately utilizing RUE more; built up handle on toothbrush.   Upper Body Bathing Details (indicate cue type and reason): anticipate MIN-MOD A   Lower Body Bathing Details (indicate cue type and reason): anticipate MOD A   Upper Body Dressing Details (indicate cue type and reason): anticipate MIN A   Lower Body Dressing Details (indicate cue type and reason): anticipate MOD A   Toilet Transfer Details (indicate cue type and reason): anticipate CGA-MIN A +1  to high toilet; not tested today; t/f to recliner.   Toileting - Clothing Manipulation Details (indicate cue type and reason): anticipate MOD A   Tub/Shower Transfer Details (indicate cue type and reason): pt sponge bathes at baseline. Functional mobility during ADLs: Contact guard  assist;Cueing for safety;Cueing for sequencing;Rolling walker (2 wheels) General ADL Comments: Would recommend seated ADLs at this time for safety.     Vision Baseline Vision/History: 1 Wears glasses       Perception         Praxis         Pertinent Vitals/Pain Pain Assessment Pain Assessment: 0-10 Pain Score: 3  Pain Location: R arm; specifically hand and wrist. Pain Descriptors / Indicators: Aching Pain Intervention(s): Limited activity within patient's tolerance, Monitored during session, Other (comment) (elevated on pillows at end of session)     Extremity/Trunk Assessment Upper Extremity Assessment Upper Extremity Assessment: Right hand dominant;RUE deficits/detail RUE Deficits / Details: cellulitis; red arm/hand, edematous hand. decreased wrist ROM; decreased R hand grip (thumb and index finger able to grip, middle finger weaker, 4th and 5th fingers not assisting much in grip); used built up handle with mild success for R-handed grooming. R shoulder WNL ROM.   Lower Extremity Assessment Lower Extremity Assessment: Defer to PT evaluation       Communication Communication Communication: Difficulty following commands/understanding Following commands: Follows one step commands consistently Cueing Techniques: Verbal cues;Visual cues   Cognition Arousal: Alert Behavior During Therapy: WFL for tasks assessed/performed Overall Cognitive Status: History of cognitive impairments - at baseline                                 General Comments: Wife states pt is at baseline; did not explicitly test orientation due to hx of dementia. Pt required one-step verbal cues throughout session, occasionally repeated, occasionally with visual cue as well.     General Comments  Pt on room air. Purewick and chair alarm activated at end of session.    Exercises     Shoulder Instructions      Home Living Family/patient expects to be discharged to:: Private  residence Living Arrangements: Spouse/significant other;Children (adult son) Available Help at Discharge: Family;Available 24 hours/day Type of Home: House Home Access: Ramped entrance           Bathroom Shower/Tub: Tub/shower unit;Sponge bathes at baseline   Bathroom Toilet: Handicapped height     Home Equipment: Agricultural consultant (2 wheels);Rollator (4 wheels);Cane - single point;Grab bars - toilet;Grab bars - tub/shower;Shower seat   Additional Comments: adult son lives with them and works from home; is able to assist pt with transfers as needed.      Prior Functioning/Environment Prior Level of Function : Needs assist             Mobility Comments: Wife states pt is "wobbly" at baseline; furniture cruises inside the home and uses cane outside the home; also has RW and new rollator if needed. Wife denies pt falls. No HH services PTA. ADLs Comments: Wife states "I have to help him with everything"; sounds as though wife is providing set up for some ADLs (like UB dressing) and MIN-MOD for other ADLs, such as LB dressing, toileting, bathing (sponge bathes). Pt typically eats independently, but the past few days wife has been assisting due to pt's dominant RUE being in pain and LUE being uncoordinated.        OT Problem List:  Decreased strength;Decreased range of motion;Decreased activity tolerance;Impaired balance (sitting and/or standing);Decreased coordination;Decreased cognition;Decreased safety awareness;Decreased knowledge of use of DME or AE;Impaired UE functional use      OT Treatment/Interventions: Self-care/ADL training;Therapeutic exercise;Therapeutic activities;DME and/or AE instruction;Patient/family education    OT Goals(Current goals can be found in the care plan section) Acute Rehab OT Goals Patient Stated Goal: Get moving OT Goal Formulation: With family Time For Goal Achievement: 06/23/23 Potential to Achieve Goals: Good ADL Goals Pt Will Perform Eating:  with adaptive utensils;with supervision;with set-up;sitting Pt Will Perform Grooming: with adaptive equipment;with set-up;with supervision;sitting Pt Will Transfer to Toilet: with set-up;with contact guard assist;ambulating;bedside commode  OT Frequency: Min 1X/week    Co-evaluation              AM-PAC OT "6 Clicks" Daily Activity     Outcome Measure Help from another person eating meals?: A Little Help from another person taking care of personal grooming?: A Little Help from another person toileting, which includes using toliet, bedpan, or urinal?: A Lot Help from another person bathing (including washing, rinsing, drying)?: A Lot Help from another person to put on and taking off regular upper body clothing?: A Little Help from another person to put on and taking off regular lower body clothing?: A Lot 6 Click Score: 15   End of Session Equipment Utilized During Treatment: Gait belt;Rolling walker (2 wheels) Nurse Communication: Mobility status  Activity Tolerance: Patient tolerated treatment well Patient left: in chair;with call bell/phone within reach;with chair alarm set;with family/visitor present (wife in room; all questions answered)  OT Visit Diagnosis: Unsteadiness on feet (R26.81);Muscle weakness (generalized) (M62.81)                Time: 7829-5621 OT Time Calculation (min): 26 min Charges:  OT General Charges $OT Visit: 1 Visit OT Evaluation $OT Eval Moderate Complexity: 1 Mod OT Treatments $Self Care/Home Management : 8-22 mins  Linward Foster, MS, OTR/L  Alvester Morin 06/09/2023, 10:11 AM

## 2023-06-09 NOTE — Discharge Summary (Signed)
Physician Discharge Summary  Victor Ramos WGN:[id number] DOB: [birth year] DOA: 06/05/2023  PCP: [facility]  Admit date: 06/05/2023 Discharge date: 06/09/2023  Admitted From: home Disposition:  home   Recommendations for Outpatient Follow-up:  Follow up with PCP in 1-2 weeks   Home Health: yes  Equipment/Devices:  Discharge Condition: stable  CODE STATUS: DNR Diet recommendation: Heart Healthy  Brief/Interim Summary: HPI was taken from Dr. Clyde Ramos: Victor Ramos is a 82 y.o. male with medical history significant of dementia, a fib on Eliquis, HTN, HLD, CLL in remission, COPD, CAD, GIB, depression, BPH, CKD-3a, who presents with right forearm pain.   Patient has hx of dementia, and is unable to provide accurate medical history. Per his wife at bedside,  pt sustained a small laceration to his right medial forearm on the kitchen door 1 week ago, but was fine up until last night when he started having worsening pain in the right forearm, swelling, erythema and redness. It has been progressively worsening, involving right forearm and dorsal side of right hand.  Patient has low-grade fever, no chills. No chest pain, cough, shortness of breath. Pt has diarrhea today with loose stool bowel movement.  No nausea, vomiting, abdominal pain.  No symptoms of UTI. Per his wife, patient's mental status is at his baseline. [facility] but  [facility] not [facility].     Data reviewed independently and ED Course: pt was found to have WBC 16.4, platelet 101, INR 1.7, worsened renal function with creatinine of 1.71, BUN 31 and GFR 40 (most recent baseline creatinine 1.12 on 07/23/2018).  Temperature 99.4, blood pressure 159/96, heart rate 93-99, RR 23 --> 19, oxygen saturation 96% on room air.  Chest x-ray showed mild cardiomegaly without infiltration.  Venous Doppler of right upper extremity is negative for DVT.  Patient is admitted to telemetry bed as inpatient.  Discharge  Diagnoses:  Principal Problem:   Cellulitis of right upper extremity Active Problems:   Sepsis (HCC)   CAD (coronary artery disease)   HTN (hypertension)   Chronic a-fib (HCC)   COPD (chronic obstructive pulmonary disease) (HCC)   Thrombocytopenia (HCC)   Benign localized prostatic hyperplasia with lower urinary tract symptoms (LUTS)   Acute renal failure superimposed on stage 3a chronic kidney disease (HCC)   Depression   CLL (chronic lymphocytic leukemia) (HCC)   Diarrhea  Sepsis: met criteria w/ leukocytosis, tachycardia, tachypnea, elevated lactic acid & likely secondary to right arm cellulitis. Continue on IV abxs. Blood cxs NGTD. Sepsis resolved    Right arm cellulitis: much improvement. Continue on IV rocephin, vanco while inpatient and d/c home on keflex, doxycycline to complete the course. Much improved     Hx of CAD: continue on coreg, lisinopril. No longer taking aspirin as per pt's wife    HTN: continue on coreg, nifedipine, lisinopril. IV hydralazine prn    Chronic a-fib: continue on eliquis, coreg     COPD: w/o exacerbation. Bronchodilators prn     Thrombocytopenia: etiology unclear. Labile. Avoid nephrotoxic meds    BPH: continue on home dose of proscar, flomax    AKI on CKDIIIa: Cr is labile.  Avoid nephrotoxic meds    Depression: severity unknown. Continue on home dose of sertraline    CLL: in remission. Follows outpatient w/ [facility]   Diarrhea: resolved    Constipation: continue on senokot and started on dulcolax supp    Discharge Instructions  Discharge Instructions  Diet - low sodium heart healthy   Complete by: As directed    Discharge instructions   Complete by: As directed    F/u w/ PCP in 1-2 weeks   Increase activity slowly   Complete by: As directed       Allergies as of 06/09/2023       Reactions   Atorvastatin    Other Reaction(s): Delirium, Memory impairment, Delirium, Memory impairment        Medication List      STOP taking these medications    amLODipine 10 MG tablet Commonly known as: NORVASC   amoxicillin-clavulanate 875-125 MG tablet Commonly known as: AUGMENTIN   aspirin EC 81 MG tablet   benzonatate 100 MG capsule Commonly known as: TESSALON   metoprolol succinate 25 MG 24 hr tablet Commonly known as: TOPROL-XL       TAKE these medications    albuterol 108 (90 Base) MCG/ACT inhaler Commonly known as: VENTOLIN HFA Inhale into the lungs every 6 (six) hours as needed for wheezing or shortness of breath.   apixaban 5 MG Tabs tablet Commonly known as: ELIQUIS Take 5 mg by mouth.   budesonide-formoterol 160-4.5 MCG/ACT inhaler Commonly known as: SYMBICORT Inhale 2 puffs into the lungs 2 (two) times daily.   carvedilol 6.25 MG tablet Commonly known as: COREG Take 6.25 mg by mouth 2 (two) times daily with a meal.   cephALEXin 500 MG capsule Commonly known as: KEFLEX Take 1 capsule (500 mg total) by mouth 4 (four) times daily for 4 days.   cyanocobalamin 1000 MCG tablet Commonly known as: VITAMIN B12 Take 1,000 mcg by mouth daily.   docusate sodium 100 MG capsule Commonly known as: COLACE Take 100 mg by mouth 2 (two) times daily.   doxycycline 100 MG tablet Commonly known as: ADOXA Take 1 tablet (100 mg total) by mouth 2 (two) times daily for 4 days.   finasteride 5 MG tablet Commonly known as: PROSCAR Take 5 mg by mouth daily.   gentamicin cream 0.1 % Commonly known as: GARAMYCIN Apply 1 application topically 2 (two) times daily.   hydrALAZINE 25 MG tablet Commonly known as: APRESOLINE Take by mouth.   latanoprost 0.005 % ophthalmic solution Commonly known as: XALATAN Place 1 drop into the left eye at bedtime.   lisinopril 40 MG tablet Commonly known as: ZESTRIL Take by mouth.   melatonin 5 MG Tabs Take 1 tablet by mouth at bedtime.   NIFEdipine 60 MG 24 hr tablet Commonly known as: ADALAT CC Take by mouth.   senna-docusate 8.6-50 MG  tablet Commonly known as: Senokot-S Take 1 tablet by mouth daily.   sertraline 50 MG tablet Commonly known as: ZOLOFT Take 50 mg by mouth daily.   tamsulosin 0.4 MG Caps capsule Commonly known as: FLOMAX Take 0.4 mg by mouth daily.   tiotropium 18 MCG inhalation capsule Commonly known as: SPIRIVA Place 18 mcg into inhaler and inhale daily.   traZODone 50 MG tablet Commonly known as: DESYREL Take 50 mg by mouth at bedtime as needed for sleep.   vitamin D3 25 MCG tablet Commonly known as: CHOLECALCIFEROL Take 1,000 Units by mouth daily.        Allergies  Allergen Reactions   Atorvastatin     Other Reaction(s): Delirium, Memory impairment, Delirium, Memory impairment    Consultations:    Procedures/Studies: US Venous Img Upper Uni Right(DVT)  Result Date: 06/05/2023 CLINICAL DATA:  57846 Swelling 96295. Left lower lobe malignancy. Pain, edema, anticoagulation  therapy, numbness, color changes EXAM: Right UPPER EXTREMITY VENOUS DOPPLER ULTRASOUND TECHNIQUE: Gray-scale sonography with graded compression, as well as color Doppler and duplex ultrasound were performed to evaluate the upper extremity deep venous system from the level of the subclavian vein and including the jugular, axillary, basilic, radial, ulnar and upper cephalic vein. Spectral Doppler was utilized to evaluate flow at rest and with distal augmentation maneuvers. COMPARISON:  None Available. FINDINGS: Contralateral Subclavian Vein: Respiratory phasicity is normal and symmetric with the symptomatic side. No evidence of thrombus. Normal compressibility. Internal Jugular Vein: No evidence of thrombus. Normal compressibility, respiratory phasicity and response to augmentation. Subclavian Vein: No evidence of thrombus. Normal compressibility, respiratory phasicity and response to augmentation. Axillary Vein: No evidence of thrombus. Normal compressibility, respiratory phasicity and response to augmentation. Cephalic Vein:  No evidence of thrombus. Normal compressibility, respiratory phasicity and response to augmentation. Basilic Vein: No evidence of thrombus. Normal compressibility, respiratory phasicity and response to augmentation. Brachial Veins: No evidence of thrombus. Normal compressibility, respiratory phasicity and response to augmentation. Radial Veins: No evidence of thrombus. Normal compressibility, respiratory phasicity and response to augmentation. Ulnar Veins: No evidence of thrombus. Normal compressibility, respiratory phasicity and response to augmentation. Venous Reflux:  None visualized. Other Findings:  None visualized. IMPRESSION: No evidence of DVT within the right upper extremity. Electronically Signed   By: Tish Frederickson M.D.   On: 06/05/2023 21:10   DG Chest 1 View  Result Date: 06/05/2023 CLINICAL DATA:  Sepsis EXAM: CHEST  1 VIEW COMPARISON:  09/24/2005 FINDINGS: Mild cardiomegaly. Aortic atherosclerosis. No focal airspace consolidation, pleural effusion, or pneumothorax. IMPRESSION: Mild cardiomegaly. No acute cardiopulmonary findings. Electronically Signed   By: Duanne Guess D.O.   On: 06/05/2023 20:28   (Echo, Carotid, EGD, Colonoscopy, ERCP)    Subjective: Pt c/o fatigue    Discharge Exam: Vitals:   06/09/23 0324 06/09/23 0809  BP: 124/84 (!) 141/90  Pulse: 83 (!) 109  Resp: 20 18  Temp: 98 F (36.7 C) 98.8 F (37.1 C)  SpO2: 90% 94%   Vitals:   06/08/23 1656 06/08/23 2022 06/09/23 0324 06/09/23 0809  BP: 118/66 108/60 124/84 (!) 141/90  Pulse: (!) 105 64 83 (!) 109  Resp: 16 20 20 18   Temp: 98.6 F (37 C) 97.7 F (36.5 C) 98 F (36.7 C) 98.8 F (37.1 C)  TempSrc: Oral Oral Oral   SpO2: 95% 93% 90% 94%  Weight:      Height:        General: Pt is alert, awake, not in acute distress Cardiovascular: S1/S2 +, no rubs, no gallops Respiratory: CTA bilaterally, no wheezing, no rhonchi Abdominal: Soft, NT, ND, bowel sounds + Extremities: no cyanosis    The  results of significant diagnostics from this hospitalization (including imaging, microbiology, ancillary and laboratory) are listed below for reference.     Microbiology: Recent Results (from the past 240 hour(s))  Culture, blood (Routine x 2)     Status: None (Preliminary result)   Collection Time: 06/05/23  7:52 PM   Specimen: BLOOD LEFT FOREARM  Result Value Ref Range Status   Specimen Description BLOOD LEFT FOREARM  Final   Special Requests   Final    BOTTLES DRAWN AEROBIC AND ANAEROBIC Blood Culture adequate volume   Culture   Final    NO GROWTH 4 DAYS Performed at [facility], [address] [place], [place] [address]    Report Status PENDING  Incomplete  Culture, blood (Routine x 2)  Status: None (Preliminary result)   Collection Time: 06/06/23 12:59 AM   Specimen: BLOOD  Result Value Ref Range Status   Specimen Description BLOOD BLOOD LEFT HAND  Final   Special Requests   Final    BOTTLES DRAWN AEROBIC AND ANAEROBIC Blood Culture adequate volume   Culture   Final    NO GROWTH 3 DAYS Performed at [facility], [address], [place], [place] [address]    Report Status PENDING  Incomplete     Labs: BNP (last 3 results) No results for input(s): "BNP" in the last 8760 hours. Basic Metabolic Panel: Recent Labs  Lab 06/05/23 1952 06/06/23 0059 06/07/23 0453 06/08/23 0441 06/09/23 0434  NA 137 138 139 137 137  K 3.9 3.6 3.8 3.6 3.5  CL 100 105 109 108 105  CO2 26 23 21* 21* 24  GLUCOSE 127* 119* 152* 125* 120*  BUN 31* 30* 22 21 32*  CREATININE 1.71* 1.48* 1.22 1.15 1.47*  CALCIUM 9.4 8.6* 8.7* 8.5* 8.6*   Liver Function Tests: Recent Labs  Lab 06/05/23 1952  AST 15  ALT 14  ALKPHOS 41  BILITOT 1.0  PROT 8.4*  ALBUMIN 4.5   No results for input(s): "LIPASE", "AMYLASE" in the last 168 hours. No results for input(s): "AMMONIA" in the last 168 hours. CBC: Recent Labs  Lab 06/05/23 1952 06/06/23 0059 06/07/23 0453  06/08/23 0441 06/09/23 0434  WBC 16.4* 13.5* 14.2* 7.3 9.2  NEUTROABS 8.6*  --   --   --   --   HGB 14.4 11.7* 13.2 11.2* 10.8*  HCT 43.0 35.3* 40.6 33.3* 32.5*  MCV 88.7 87.2 89.8 86.5 87.4  PLT 101* 82* 109* 102* 140*   Cardiac Enzymes: No results for input(s): "CKTOTAL", "CKMB", "CKMBINDEX", "TROPONINI" in the last 168 hours. BNP: Invalid input(s): "POCBNP" CBG: No results for input(s): "GLUCAP" in the last 168 hours. D-Dimer No results for input(s): "DDIMER" in the last 72 hours. Hgb A1c No results for input(s): "HGBA1C" in the last 72 hours. Lipid Profile No results for input(s): "CHOL", "HDL", "LDLCALC", "TRIG", "CHOLHDL", "LDLDIRECT" in the last 72 hours. Thyroid function studies No results for input(s): "TSH", "T4TOTAL", "T3FREE", "THYROIDAB" in the last 72 hours.  Invalid input(s): "FREET3" Anemia work up No results for input(s): "VITAMINB12", "FOLATE", "FERRITIN", "TIBC", "IRON", "RETICCTPCT" in the last 72 hours. Urinalysis    Component Value Date/Time   COLORURINE YELLOW (A) 06/06/2023 0515   APPEARANCEUR HAZY (A) 06/06/2023 0515   LABSPEC 1.015 06/06/2023 0515   PHURINE 5.0 06/06/2023 0515   GLUCOSEU NEGATIVE 06/06/2023 0515   HGBUR SMALL (A) 06/06/2023 0515   BILIRUBINUR NEGATIVE 06/06/2023 0515   KETONESUR NEGATIVE 06/06/2023 0515   PROTEINUR 30 (A) 06/06/2023 0515   NITRITE NEGATIVE 06/06/2023 0515   LEUKOCYTESUR NEGATIVE 06/06/2023 0515   Sepsis Labs Recent Labs  Lab 06/06/23 0059 06/07/23 0453 06/08/23 0441 06/09/23 0434  WBC 13.5* 14.2* 7.3 9.2   Microbiology Recent Results (from the past 240 hour(s))  Culture, blood (Routine x 2)     Status: None (Preliminary result)   Collection Time: 06/05/23  7:52 PM   Specimen: BLOOD LEFT FOREARM  Result Value Ref Range Status   Specimen Description BLOOD LEFT FOREARM  Final   Special Requests   Final    BOTTLES DRAWN AEROBIC AND ANAEROBIC Blood Culture adequate volume   Culture   Final    NO  GROWTH 4 DAYS Performed at [facility], [address], [place], [place] [address]  Report Status PENDING  Incomplete  Culture, blood (Routine x 2)     Status: None (Preliminary result)   Collection Time: 06/06/23 12:59 AM   Specimen: BLOOD  Result Value Ref Range Status   Specimen Description BLOOD BLOOD LEFT HAND  Final   Special Requests   Final    BOTTLES DRAWN AEROBIC AND ANAEROBIC Blood Culture adequate volume   Culture   Final    NO GROWTH 3 DAYS Performed at [facility], [address] [place], [place] [address]    Report Status PENDING  Incomplete     Time coordinating discharge: Over 30 minutes  SIGNED:   Charise Killian, MD  [facility] 06/09/2023, 12:02 PM Pager   If 7PM-7AM, please contact night-coverage [email or web address]

## 2023-09-01 ENCOUNTER — Ambulatory Visit: Payer: Medicare PPO | Admitting: Podiatry

## 2023-09-01 DIAGNOSIS — B351 Tinea unguium: Secondary | ICD-10-CM | POA: Diagnosis not present

## 2023-09-01 DIAGNOSIS — M79674 Pain in right toe(s): Secondary | ICD-10-CM | POA: Diagnosis not present

## 2023-09-01 DIAGNOSIS — M2042 Other hammer toe(s) (acquired), left foot: Secondary | ICD-10-CM

## 2023-09-01 DIAGNOSIS — M2041 Other hammer toe(s) (acquired), right foot: Secondary | ICD-10-CM | POA: Diagnosis not present

## 2023-09-01 DIAGNOSIS — M79675 Pain in left toe(s): Secondary | ICD-10-CM | POA: Diagnosis not present

## 2023-09-01 DIAGNOSIS — D689 Coagulation defect, unspecified: Secondary | ICD-10-CM

## 2023-09-01 NOTE — Progress Notes (Unsigned)
  Subjective:  Patient ID: Victor Ramos, male    DOB: 09/20/1941,  MRN: [id number]  82 y.o. male presents painful thick toenails that are difficult to trim. Pain interferes with ambulation. Aggravating factors include wearing enclosed shoe gear. Pain is relieved with periodic professional debridement.  New problem(s): None {jgcomplaint:23593}  PCP is [facility] , and last visit was {Time; dates multiple:15870}.  Allergies  Allergen Reactions   Atorvastatin     Other Reaction(s): Delirium, Memory impairment, Delirium, Memory impairment    Review of Systems: Negative except as noted in the HPI.   Objective:  PAULETTE LYNCH is a pleasant 82 y.o. male {jgbodyhabitus:24098} AAO x 3.  Vascular Examination: Vascular status intact b/l with palpable pedal pulses. CFT immediate b/l. Pedal hair present. No edema. No pain with calf compression b/l. Skin temperature gradient WNL b/l. No varicosities noted. No cyanosis or clubbing noted.  Neurological Examination: Sensation grossly intact b/l with 10 gram monofilament. Vibratory sensation intact b/l.  Dermatological Examination: Pedal skin with normal turgor, texture and tone b/l. No open wounds nor interdigital macerations noted. Toenails 1-5 b/l thick, discolored, elongated with subungual debris and pain on dorsal palpation. No hyperkeratotic lesions noted b/l.   Musculoskeletal Examination: Muscle strength 5/5 to b/l LE.  No pain, crepitus noted b/l. Rigid hammertoe deformity b/l 2nd toes L>R.  Radiographs: None  Last A1c:       No data to display           Assessment:   1. Pain due to onychomycosis of toenails of both feet   2. 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 All questions/concerns addressed on today's visit. -Patient to continue soft, supportive shoe gear daily. -Wife instructed to apply Voltaren Gel to toes once daily for hammertoe pain. Recommended new pair of  shoes. -Mycotic toenails 1-5 bilaterally were debrided in length and girth with sterile nail nippers and dremel without incident. -Patient/POA to call should there be question/concern in the interim.  Return in about 4 months (around 12/30/2023).  [name], [name]

## 2023-09-04 ENCOUNTER — Encounter: Payer: Self-pay | Admitting: Podiatry

## 2023-09-30 ENCOUNTER — Ambulatory Visit: Payer: Medicare PPO

## 2023-09-30 DIAGNOSIS — Z719 Counseling, unspecified: Secondary | ICD-10-CM

## 2023-09-30 DIAGNOSIS — Z23 Encounter for immunization: Secondary | ICD-10-CM | POA: Diagnosis not present

## 2023-09-30 NOTE — Progress Notes (Signed)
Came to receive Covid-19 vaccine and High dose flu shot. Accompanied by spouse who is caregiver-pt has dementia. Went to car to administer vaccines. VIS provided. Tolerated vaccine well.NCIR updated and copies given to spouse. observation no adverse effects noted.

## 2023-12-29 ENCOUNTER — Encounter: Payer: Self-pay | Admitting: Podiatry

## 2023-12-29 ENCOUNTER — Ambulatory Visit: Payer: Medicare PPO | Admitting: Podiatry

## 2023-12-29 VITALS — Ht 69.0 in | Wt 180.0 lb

## 2023-12-29 DIAGNOSIS — M79675 Pain in left toe(s): Secondary | ICD-10-CM | POA: Diagnosis not present

## 2023-12-29 DIAGNOSIS — M79674 Pain in right toe(s): Secondary | ICD-10-CM

## 2023-12-29 DIAGNOSIS — D689 Coagulation defect, unspecified: Secondary | ICD-10-CM

## 2023-12-29 DIAGNOSIS — B351 Tinea unguium: Secondary | ICD-10-CM

## 2024-01-01 NOTE — Progress Notes (Signed)
  Subjective:  Patient ID: Victor Ramos, male    DOB: [birth year],  MRN: [id number]  83 y.o. male presents to clinic with  at risk foot care with h/o coagulation defect and painful, elongated thickened toenails x 10 which are symptomatic when wearing enclosed shoe gear. This interferes with his/her daily activities.  He is accompanied by his wife on today's visit. Chief Complaint  Patient presents with   Nail Problem    Pt is here for RFC pt is seen at [facility] for all healthcare needs LOV was in October.   New problem(s): None   PCP is [facility].  Allergies  Allergen Reactions   Atorvastatin     Other Reaction(s): Delirium, Memory impairment, Delirium, Memory impairment   Review of Systems: Negative except as noted in the HPI.   Objective:  JAYDEN KRATOCHVIL is a pleasant 83 y.o. male WD, WN in NAD. AAO x 3.  Vascular Examination: Vascular status intact b/l with palpable pedal pulses. CFT immediate b/l. No edema. No pain with calf compression b/l. Skin temperature gradient WNL b/l. No ischemia or gangrene noted b/l LE. No cyanosis or clubbing noted b/l LE.  Neurological Examination: Sensation grossly intact b/l with 10 gram monofilament. Vibratory sensation intact b/l.   Dermatological Examination: Pedal skin with normal turgor, texture and tone b/l. Toenails 1-5 b/l thick, discolored, elongated with subungual debris and pain on dorsal palpation. No corns, calluses nor porokeratotic lesions noted.  Musculoskeletal Examination: Muscle strength 5/5 to b/l LE. Severe hammertoe deformity noted bilateral 2nd toes left >right. Utilizes cane for ambulation assistance.  Radiographs: None  Last A1c:       No data to display         Assessment:   1. Pain due to onychomycosis of toenails of both feet   2. 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 All patient's and/or POA's questions/concerns addressed on today's visit. Toenails 1-5  debrided in length and girth without incident. Continue soft, supportive shoe gear daily. Report any pedal injuries to medical professional. Call office if there are any questions/concerns. -Patient/POA to call should there be question/concern in the interim.  Return in about 4 months (around 04/29/2024).  [name], [name]      [place] LOCATION: [address], [place] [address]                   Office [phone]   [facility] LOCATION: [address] [place], [place] [address] Office [phone]

## 2024-05-06 ENCOUNTER — Ambulatory Visit: Admitting: Podiatry

## 2024-05-06 ENCOUNTER — Encounter: Payer: Self-pay | Admitting: Podiatry

## 2024-05-06 VITALS — Ht 69.0 in | Wt 180.0 lb

## 2024-05-06 DIAGNOSIS — Z7901 Long term (current) use of anticoagulants: Secondary | ICD-10-CM | POA: Insufficient documentation

## 2024-05-06 DIAGNOSIS — D689 Coagulation defect, unspecified: Secondary | ICD-10-CM | POA: Diagnosis not present

## 2024-05-06 DIAGNOSIS — K5900 Constipation, unspecified: Secondary | ICD-10-CM | POA: Insufficient documentation

## 2024-05-06 DIAGNOSIS — N1831 Chronic kidney disease, stage 3a: Secondary | ICD-10-CM | POA: Insufficient documentation

## 2024-05-06 DIAGNOSIS — M79674 Pain in right toe(s): Secondary | ICD-10-CM | POA: Diagnosis not present

## 2024-05-06 DIAGNOSIS — G4700 Insomnia, unspecified: Secondary | ICD-10-CM | POA: Insufficient documentation

## 2024-05-06 DIAGNOSIS — Z659 Problem related to unspecified psychosocial circumstances: Secondary | ICD-10-CM | POA: Insufficient documentation

## 2024-05-06 DIAGNOSIS — B351 Tinea unguium: Secondary | ICD-10-CM | POA: Diagnosis not present

## 2024-05-06 DIAGNOSIS — Z955 Presence of coronary angioplasty implant and graft: Secondary | ICD-10-CM | POA: Insufficient documentation

## 2024-05-06 DIAGNOSIS — M79675 Pain in left toe(s): Secondary | ICD-10-CM | POA: Diagnosis not present

## 2024-05-06 DIAGNOSIS — F0280 Dementia in other diseases classified elsewhere without behavioral disturbance: Secondary | ICD-10-CM | POA: Insufficient documentation

## 2024-05-06 DIAGNOSIS — R1310 Dysphagia, unspecified: Secondary | ICD-10-CM | POA: Insufficient documentation

## 2024-05-06 DIAGNOSIS — R2689 Other abnormalities of gait and mobility: Secondary | ICD-10-CM | POA: Insufficient documentation

## 2024-05-06 DIAGNOSIS — C8590 Non-Hodgkin lymphoma, unspecified, unspecified site: Secondary | ICD-10-CM | POA: Insufficient documentation

## 2024-05-06 DIAGNOSIS — I671 Cerebral aneurysm, nonruptured: Secondary | ICD-10-CM | POA: Insufficient documentation

## 2024-05-06 DIAGNOSIS — C8510 Unspecified B-cell lymphoma, unspecified site: Secondary | ICD-10-CM | POA: Insufficient documentation

## 2024-05-06 NOTE — Progress Notes (Signed)
  Subjective:  Patient ID: Victor Ramos, male    DOB: 1941-09-11,  MRN: [id number]  Victor Ramos presents to clinic today for at risk foot care with h/o coagulation defect and painful thick toenails that are difficult to trim. Pain interferes with ambulation. Aggravating factors include wearing enclosed shoe gear. Pain is relieved with periodic professional debridement. He is accompanied by his wife on today's visit. Chief Complaint  Patient presents with   Nail Problem    Pt is here for [facility], is seen at [facility] center for medical needs LOV was in April.   New problem(s): None.   PCP is [facility].  Allergies  Allergen Reactions   Atorvastatin     Other Reaction(s): Delirium, Memory impairment, Delirium, Memory impairment    Review of Systems: Negative except as noted in the HPI.  Objective: No changes noted in today's physical examination. There were no vitals filed for this visit. Victor Ramos is a pleasant 83 y.o. male WD, WN in NAD. AAO x 3.  Vascular Examination: Capillary refill time immediate b/l. Palpable pedal pulses. No pain with calf compression b/l. Skin temperature gradient WNL b/l. No cyanosis or clubbing b/l. No ischemia or gangrene noted b/l. Pedal hair absent.  Neurological Examination: Sensation grossly intact b/l with 10 gram monofilament.  Dermatological Examination: Pedal skin thin, shiny and atrophic b/l LE.  Toenails 1-5 b/l thick, discolored, elongated with subungual debris and pain on dorsal palpation.   No hyperkeratotic nor porokeratotic lesions present on today's visit.  Musculoskeletal Examination: Muscle strength 5/5 to all lower extremity muscle groups bilaterally. No pain, crepitus or joint limitation noted with ROM b/l LE. Severe hammertoe deformity noted b/l lower extremities left >right.. Patient ambulates with cane assistance.  Radiographs: None  Assessment/Plan: 1. Pain due to onychomycosis of toenails of  both feet   2. 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 Patient examined. All patient's and/or POA's questions/concerns addressed on today's visit. Mycotic toenails 1-5 debrided in length and girth without incident. Continue soft, supportive shoe gear daily. Report any pedal injuries to medical professional. Call office if there are any quesitons/concerns. -Patient/POA to call should there be question/concern in the interim.   Return in about 4 months (around 09/06/2024).  Delon LITTIE Merlin, [name]      [place] LOCATION: [address] [place] [address]                   Office [phone]   [facility] LOCATION: [address] [place], [place] [address] Office [phone]

## 2024-09-06 ENCOUNTER — Encounter: Payer: Self-pay | Admitting: Podiatry

## 2024-09-06 ENCOUNTER — Ambulatory Visit: Admitting: Podiatry

## 2024-09-06 DIAGNOSIS — D689 Coagulation defect, unspecified: Secondary | ICD-10-CM | POA: Diagnosis not present

## 2024-09-06 DIAGNOSIS — M79674 Pain in right toe(s): Secondary | ICD-10-CM

## 2024-09-06 DIAGNOSIS — M79675 Pain in left toe(s): Secondary | ICD-10-CM

## 2024-09-06 DIAGNOSIS — B351 Tinea unguium: Secondary | ICD-10-CM

## 2024-09-06 NOTE — Progress Notes (Signed)
  Subjective:  Patient ID: Victor Ramos, male    DOB: 05-03-1941,  MRN: [id number]  83 y.o. male presents at risk foot care with h/o coagulation defect and painful mycotic toenails of both feet that are difficult to trim. Pain interferes with daily activities and wearing enclosed shoe gear comfortably. He is accompanied by his wife on today's visit. Chief Complaint  Patient presents with   Toe Pain    [facility] is his PCP. He denies being diabetic.    New problem(s): None   [facility]P is [facility] [employer]. Victor Ramos 04/20/2024.  Allergies  Allergen Reactions   Atorvastatin     Other Reaction(s): Delirium, Memory impairment, Delirium, Memory impairment   Review of Systems: Negative except as noted in the HPI.   Objective:  Victor Ramos is a pleasant 83 y.o. male WD, WN in NAD. AAO x 3.  Vascular Examination: Vascular status intact b/l with palpable pedal pulses. CFT immediate b/l. Pedal hair diminished. No edema. No pain with calf compression b/l. Skin temperature gradient WNL b/l. No varicosities noted. No cyanosis or clubbing noted.  Neurological Examination: Sensation grossly intact b/l with 10 gram monofilament. Vibratory sensation intact b/l.  Dermatological Examination: Pedal skin with normal turgor, texture and tone b/l. No open wounds nor interdigital macerations noted. Toenails 1-5 b/l thick, discolored, elongated with subungual debris and pain on dorsal palpation. No hyperkeratotic lesions noted b/l.   Musculoskeletal Examination: Muscle strength 5/5 to all lower extremity muscle groups bilaterally. No pain, crepitus or joint limitation noted with ROM b/l LE. Severe hammertoe deformity noted b/l lower extremities left >right.. Patient ambulates with cane assistance.  Radiographs: None Assessment:   1. Pain due to onychomycosis of toenails of both feet   2. Coagulation defect    Plan:  Patient was evaluated and treated. All patient's and/or POA's  questions/concerns addressed on today's visit. Toenails 1-5 b/l debrided in length and girth without incident. Continue soft, supportive shoe gear daily. Report any pedal injuries to medical professional. Call office if there are any questions/concerns. -Patient/POA to call should there be question/concern in the interim.  Return in about 4 months (around 01/04/2025).  Victor Ramos Merlin, [name]      [place] LOCATION: [address] [place] [address]                   Office [phone]   [facility] LOCATION: [address] [place], [place] [address] Office [phone]

## 2025-01-06 ENCOUNTER — Ambulatory Visit: Admitting: Podiatry
